# Patient Record
Sex: Female | Born: 1935 | Race: White | Hispanic: No | Marital: Married | Smoking: Never smoker
Health system: Southern US, Community
[De-identification: ages and names within clinical notes are randomized; demographics above are authoritative.]

## PROBLEM LIST (undated history)

## (undated) DIAGNOSIS — K8020 Calculus of gallbladder without cholecystitis without obstruction: Secondary | ICD-10-CM

## (undated) DIAGNOSIS — R011 Cardiac murmur, unspecified: Secondary | ICD-10-CM

## (undated) DIAGNOSIS — H8390 Unspecified disease of inner ear, unspecified ear: Secondary | ICD-10-CM

## (undated) DIAGNOSIS — I10 Essential (primary) hypertension: Secondary | ICD-10-CM

## (undated) DIAGNOSIS — Z8719 Personal history of other diseases of the digestive system: Secondary | ICD-10-CM

## (undated) DIAGNOSIS — D649 Anemia, unspecified: Secondary | ICD-10-CM

## (undated) DIAGNOSIS — E785 Hyperlipidemia, unspecified: Secondary | ICD-10-CM

## (undated) DIAGNOSIS — K219 Gastro-esophageal reflux disease without esophagitis: Secondary | ICD-10-CM

## (undated) HISTORY — PX: OTHER SURGICAL HISTORY: SHX169

## (undated) HISTORY — PX: TONSILECTOMY, ADENOIDECTOMY, BILATERAL MYRINGOTOMY AND TUBES: SHX2538

---

## 1998-10-05 ENCOUNTER — Encounter (HOSPITAL_COMMUNITY): Admission: RE | Admit: 1998-10-05 | Discharge: 1998-11-29 | Payer: Self-pay | Admitting: Family Medicine

## 1998-11-30 ENCOUNTER — Encounter (HOSPITAL_COMMUNITY): Admission: RE | Admit: 1998-11-30 | Discharge: 1999-02-28 | Payer: Self-pay | Admitting: Family Medicine

## 1999-02-28 ENCOUNTER — Encounter: Admission: RE | Admit: 1999-02-28 | Discharge: 1999-04-15 | Payer: Self-pay | Admitting: Family Medicine

## 2001-12-16 ENCOUNTER — Encounter: Payer: Self-pay | Admitting: Family Medicine

## 2001-12-16 ENCOUNTER — Encounter: Admission: RE | Admit: 2001-12-16 | Discharge: 2001-12-16 | Payer: Self-pay | Admitting: Family Medicine

## 2003-04-02 ENCOUNTER — Emergency Department (HOSPITAL_COMMUNITY): Admission: EM | Admit: 2003-04-02 | Discharge: 2003-04-02 | Payer: Self-pay | Admitting: Emergency Medicine

## 2013-08-04 ENCOUNTER — Encounter (HOSPITAL_BASED_OUTPATIENT_CLINIC_OR_DEPARTMENT_OTHER): Payer: Self-pay | Attending: Plastic Surgery

## 2015-01-09 ENCOUNTER — Encounter (HOSPITAL_COMMUNITY): Payer: Self-pay | Admitting: Emergency Medicine

## 2015-01-09 ENCOUNTER — Emergency Department (HOSPITAL_COMMUNITY): Payer: Medicare Other

## 2015-01-09 ENCOUNTER — Inpatient Hospital Stay (HOSPITAL_COMMUNITY)
Admission: EM | Admit: 2015-01-09 | Discharge: 2015-01-11 | DRG: 291 | Disposition: A | Discharge status: Home / Self Care | Payer: Medicare Other | Attending: Internal Medicine | Admitting: Internal Medicine

## 2015-01-09 DIAGNOSIS — R8290 Unspecified abnormal findings in urine: Secondary | ICD-10-CM | POA: Diagnosis present

## 2015-01-09 DIAGNOSIS — K219 Gastro-esophageal reflux disease without esophagitis: Secondary | ICD-10-CM | POA: Diagnosis present

## 2015-01-09 DIAGNOSIS — Z8719 Personal history of other diseases of the digestive system: Secondary | ICD-10-CM

## 2015-01-09 DIAGNOSIS — D649 Anemia, unspecified: Secondary | ICD-10-CM

## 2015-01-09 DIAGNOSIS — I10 Essential (primary) hypertension: Secondary | ICD-10-CM | POA: Diagnosis present

## 2015-01-09 DIAGNOSIS — I509 Heart failure, unspecified: Secondary | ICD-10-CM | POA: Diagnosis not present

## 2015-01-09 DIAGNOSIS — J9621 Acute and chronic respiratory failure with hypoxia: Secondary | ICD-10-CM | POA: Diagnosis present

## 2015-01-09 DIAGNOSIS — I5033 Acute on chronic diastolic (congestive) heart failure: Secondary | ICD-10-CM | POA: Diagnosis present

## 2015-01-09 DIAGNOSIS — R0602 Shortness of breath: Secondary | ICD-10-CM | POA: Diagnosis present

## 2015-01-09 DIAGNOSIS — R011 Cardiac murmur, unspecified: Secondary | ICD-10-CM | POA: Diagnosis not present

## 2015-01-09 DIAGNOSIS — I248 Other forms of acute ischemic heart disease: Secondary | ICD-10-CM | POA: Diagnosis present

## 2015-01-09 DIAGNOSIS — E785 Hyperlipidemia, unspecified: Secondary | ICD-10-CM | POA: Diagnosis present

## 2015-01-09 HISTORY — DX: Gastro-esophageal reflux disease without esophagitis: K21.9

## 2015-01-09 HISTORY — DX: Anemia, unspecified: D64.9

## 2015-01-09 HISTORY — DX: Essential (primary) hypertension: I10

## 2015-01-09 HISTORY — DX: Personal history of other diseases of the digestive system: Z87.19

## 2015-01-09 HISTORY — DX: Calculus of gallbladder without cholecystitis without obstruction: K80.20

## 2015-01-09 HISTORY — DX: Unspecified disease of inner ear, unspecified ear: H83.90

## 2015-01-09 HISTORY — DX: Hyperlipidemia, unspecified: E78.5

## 2015-01-09 HISTORY — DX: Cardiac murmur, unspecified: R01.1

## 2015-01-09 LAB — RETICULOCYTES
RBC.: 3.69 MIL/uL — ABNORMAL LOW (ref 3.87–5.11)
RETIC COUNT ABSOLUTE: 40.6 10*3/uL (ref 19.0–186.0)
RETIC CT PCT: 1.1 % (ref 0.4–3.1)

## 2015-01-09 LAB — URINALYSIS, ROUTINE W REFLEX MICROSCOPIC
Bilirubin Urine: NEGATIVE
Glucose, UA: NEGATIVE mg/dL
Ketones, ur: NEGATIVE mg/dL
Nitrite: POSITIVE — AB
Protein, ur: NEGATIVE mg/dL
Specific Gravity, Urine: 1.011 (ref 1.005–1.030)
Urobilinogen, UA: 0.2 mg/dL (ref 0.0–1.0)
pH: 5 (ref 5.0–8.0)

## 2015-01-09 LAB — IRON AND TIBC
Iron: 41 ug/dL (ref 28–170)
Saturation Ratios: 14 % (ref 10.4–31.8)
TIBC: 300 ug/dL (ref 250–450)
UIBC: 259 ug/dL

## 2015-01-09 LAB — MAGNESIUM: MAGNESIUM: 2.1 mg/dL (ref 1.7–2.4)

## 2015-01-09 LAB — COMPREHENSIVE METABOLIC PANEL
ALT: 16 U/L (ref 14–54)
AST: 21 U/L (ref 15–41)
Albumin: 3.8 g/dL (ref 3.5–5.0)
Alkaline Phosphatase: 66 U/L (ref 38–126)
Anion gap: 6 (ref 5–15)
BUN: 21 mg/dL — ABNORMAL HIGH (ref 6–20)
CO2: 25 mmol/L (ref 22–32)
Calcium: 9 mg/dL (ref 8.9–10.3)
Chloride: 107 mmol/L (ref 101–111)
Creatinine, Ser: 1.09 mg/dL — ABNORMAL HIGH (ref 0.44–1.00)
GFR calc Af Amer: 50 mL/min — ABNORMAL LOW (ref >60)
GFR calc non Af Amer: 43 mL/min — ABNORMAL LOW (ref >60)
Glucose, Bld: 105 mg/dL — ABNORMAL HIGH (ref 65–99)
Potassium: 4.6 mmol/L (ref 3.5–5.1)
Sodium: 138 mmol/L (ref 135–145)
Total Bilirubin: 0.8 mg/dL (ref 0.3–1.2)
Total Protein: 6.9 g/dL (ref 6.5–8.1)

## 2015-01-09 LAB — URINE MICROSCOPIC-ADD ON

## 2015-01-09 LAB — CBC WITH DIFFERENTIAL/PLATELET
Basophils Absolute: 0 10*3/uL (ref 0.0–0.1)
Basophils Relative: 0 % (ref 0–1)
Eosinophils Absolute: 0 10*3/uL (ref 0.0–0.7)
Eosinophils Relative: 1 % (ref 0–5)
HCT: 34.7 % — ABNORMAL LOW (ref 36.0–46.0)
Hemoglobin: 10.7 g/dL — ABNORMAL LOW (ref 12.0–15.0)
Lymphocytes Relative: 49 % — ABNORMAL HIGH (ref 12–46)
Lymphs Abs: 4.1 10*3/uL — ABNORMAL HIGH (ref 0.7–4.0)
MCH: 28.7 pg (ref 26.0–34.0)
MCHC: 30.8 g/dL (ref 30.0–36.0)
MCV: 93 fL (ref 78.0–100.0)
Monocytes Absolute: 0.4 10*3/uL (ref 0.1–1.0)
Monocytes Relative: 5 % (ref 3–12)
Neutro Abs: 3.7 10*3/uL (ref 1.7–7.7)
Neutrophils Relative %: 45 % (ref 43–77)
Platelets: 147 10*3/uL — ABNORMAL LOW (ref 150–400)
RBC: 3.73 MIL/uL — ABNORMAL LOW (ref 3.87–5.11)
RDW: 16.5 % — ABNORMAL HIGH (ref 11.5–15.5)
WBC: 8.3 10*3/uL (ref 4.0–10.5)

## 2015-01-09 LAB — FOLATE: Folate: 10.4 ng/mL (ref >5.9)

## 2015-01-09 LAB — BRAIN NATRIURETIC PEPTIDE: B Natriuretic Peptide: 1937.2 pg/mL — ABNORMAL HIGH (ref 0.0–100.0)

## 2015-01-09 LAB — VITAMIN B12: Vitamin B-12: 224 pg/mL (ref 180–914)

## 2015-01-09 LAB — FERRITIN: FERRITIN: 30 ng/mL (ref 11–307)

## 2015-01-09 LAB — TSH: TSH: 3.757 u[IU]/mL (ref 0.350–4.500)

## 2015-01-09 LAB — TROPONIN I: Troponin I: 0.1 ng/mL — ABNORMAL HIGH (ref <0.031)

## 2015-01-09 MED ORDER — SODIUM CHLORIDE 0.9 % IJ SOLN
3.0000 mL | Freq: Two times a day (BID) | INTRAMUSCULAR | Status: DC
  Administered 2015-01-09 – 2015-01-11 (×4): 3 mL via INTRAVENOUS

## 2015-01-09 MED ORDER — SODIUM CHLORIDE 0.9 % IV SOLN: 250.0000 mL | INTRAVENOUS | Status: DC | PRN

## 2015-01-09 MED ORDER — NITROGLYCERIN 2 % TD OINT
1.0000 [in_us] | TOPICAL_OINTMENT | Freq: Once | TRANSDERMAL | Status: AC
  Administered 2015-01-09: 1 [in_us] via TOPICAL
  Filled 2015-01-09: qty 1

## 2015-01-09 MED ORDER — FUROSEMIDE 10 MG/ML IJ SOLN
20.0000 mg | Freq: Once | INTRAMUSCULAR | Status: AC
  Administered 2015-01-09: 20 mg via INTRAVENOUS
  Filled 2015-01-09: qty 2

## 2015-01-09 MED ORDER — CARVEDILOL 3.125 MG PO TABS
3.1250 mg | ORAL_TABLET | Freq: Two times a day (BID) | ORAL | Status: DC
Start: 2015-01-09 — End: 2015-01-11
  Administered 2015-01-09 – 2015-01-11 (×4): 3.125 mg via ORAL
  Filled 2015-01-09 (×4): qty 1

## 2015-01-09 MED ORDER — ACETAMINOPHEN 325 MG PO TABS
650.0000 mg | ORAL_TABLET | ORAL | Status: DC | PRN
  Administered 2015-01-09 – 2015-01-10 (×3): 650 mg via ORAL
  Filled 2015-01-09 (×3): qty 2

## 2015-01-09 MED ORDER — ENOXAPARIN SODIUM 40 MG/0.4ML ~~LOC~~ SOLN
40.0000 mg | SUBCUTANEOUS | Status: DC
  Administered 2015-01-09 – 2015-01-10 (×2): 40 mg via SUBCUTANEOUS
  Filled 2015-01-09 (×2): qty 0.4

## 2015-01-09 MED ORDER — ASPIRIN EC 81 MG PO TBEC
81.0000 mg | DELAYED_RELEASE_TABLET | Freq: Every day | ORAL | Status: DC
  Administered 2015-01-09 – 2015-01-11 (×3): 81 mg via ORAL
  Filled 2015-01-09 (×3): qty 1

## 2015-01-09 MED ORDER — SIMETHICONE 80 MG PO CHEW: 80.0000 mg | CHEWABLE_TABLET | Freq: Four times a day (QID) | ORAL | Status: DC | PRN

## 2015-01-09 MED ORDER — GI COCKTAIL ~~LOC~~
30.0000 mL | Freq: Three times a day (TID) | ORAL | Status: DC | PRN
  Administered 2015-01-10 (×2): 30 mL via ORAL
  Filled 2015-01-09 (×3): qty 30

## 2015-01-09 MED ORDER — PANTOPRAZOLE SODIUM 40 MG PO TBEC
40.0000 mg | DELAYED_RELEASE_TABLET | Freq: Every day | ORAL | Status: DC
  Administered 2015-01-10 – 2015-01-11 (×2): 40 mg via ORAL
  Filled 2015-01-09 (×2): qty 1

## 2015-01-09 MED ORDER — SODIUM CHLORIDE 0.9 % IJ SOLN: 3.0000 mL | INTRAMUSCULAR | Status: DC | PRN

## 2015-01-09 MED ORDER — DIMENHYDRINATE 50 MG PO TABS
50.0000 mg | ORAL_TABLET | Freq: Three times a day (TID) | ORAL | Status: DC | PRN
  Filled 2015-01-09: qty 1

## 2015-01-09 MED ORDER — SIMVASTATIN 20 MG PO TABS
20.0000 mg | ORAL_TABLET | Freq: Every day | ORAL | Status: DC
  Administered 2015-01-09 – 2015-01-10 (×2): 20 mg via ORAL
  Filled 2015-01-09 (×2): qty 1

## 2015-01-09 MED ORDER — ONDANSETRON HCL 4 MG/2ML IJ SOLN
4.0000 mg | Freq: Four times a day (QID) | INTRAMUSCULAR | Status: DC | PRN
  Administered 2015-01-11 (×2): 4 mg via INTRAVENOUS
  Filled 2015-01-09 (×2): qty 2

## 2015-01-09 MED ORDER — FUROSEMIDE 10 MG/ML IJ SOLN
20.0000 mg | Freq: Two times a day (BID) | INTRAMUSCULAR | Status: DC
  Administered 2015-01-09 – 2015-01-10 (×2): 20 mg via INTRAVENOUS
  Filled 2015-01-09 (×2): qty 2

## 2015-01-09 NOTE — ED Notes (Signed)
Patient transported to X-ray 

## 2015-01-09 NOTE — ED Notes (Signed)
Son stated, shes been feeling sick for about 2 weeks with gallstones which they can't do surgery cause of her heart.  Shes been SOB and having wheezing spells.

## 2015-01-09 NOTE — H&P (Signed)
[facility] History and Physical  Rebecca Woodard:[id number] DOB: 1922-11-11 DOA: 01/09/2015  Referring physician: Dr. Rennis Chris PCP: Lupita Raider, MD   Chief Complaint: Shortness of breath  HPI: Rebecca Woodard is a 81 y.o. female  With history of hypertension, hyperlipidemia, heart murmur, gallstones, in the ear disease presented to the ED with a 2-3 week history of worsening shortness of breath on exertion occurring especially in the mornings when she wakes up in the mornings per her family. Family also reports physicians had some paroxysmal nocturnal dyspnea and questionable occasional orthopnea. Patient and family deny any chest pain. No fever, no chills. Family does endorse that patient gets nauseous and sometimes with emesis when she eats and they feel by be secondary to a gallstones. Patient denies any constipation, no dysuria, no cough, no melanotic, no hematemesis, no hematochezia. Patient does states may have some occasional diarrhea however none at this time. Patient does endorse some generalized weakness. Patient was seen in the ED compressive metabolic profile.had a BUN of 21 creatinine of 1.09 glucose of 105 otherwise was within normal limits. BNP was 1937.2. Chest x-ray was consistent with CHF/volume overload. CBC had a hemoglobin of 10.7 platelet count of 147 otherwise was within normal limits. Urinalysis had trace leukocytes positive nitrite 3-6 WBCs. EKG had normal sinus rhythm with poor R-wave progression, prolonged QTC, prolonged PR interval. No ischemic changes noted.   Review of Systems:  As per history of present illness otherwise negative. Constitutional:  No weight loss, night sweats, Fevers, chills, fatigue.  HEENT:  No headaches, Difficulty swallowing,Tooth/dental problems,Sore throat,  No sneezing, itching, ear ache, nasal congestion, post nasal drip,  Cardio-vascular:  No chest pain, Orthopnea, PND, swelling in lower extremities, anasarca, dizziness,  palpitations  GI:  No heartburn, indigestion, abdominal pain, nausea, vomiting, diarrhea, change in bowel habits, loss of appetite  Resp:  No shortness of breath with exertion or at rest. No excess mucus, no productive cough, No non-productive cough, No coughing up of blood.No change in color of mucus.No wheezing.No chest Ghazarian deformity  Skin:  no rash or lesions.  GU:  no dysuria, change in color of urine, no urgency or frequency. No flank pain.  Musculoskeletal:  No joint pain or swelling. No decreased range of motion. No back pain.  Psych:  No change in mood or affect. No depression or anxiety. No memory loss.   Past Medical History  Diagnosis Date  . Acid reflux   . Gallstones   . Heart murmur   . Inner ear disease   . HTN (hypertension) 01/09/2015  . GERD (gastroesophageal reflux disease) 01/09/2015  . Hyperlipidemia 01/09/2015  . History of gallstones 01/09/2015  . Anemia 01/09/2015   History reviewed. No pertinent past surgical history. Social History:  reports that she has never smoked. She does not have any smokeless tobacco history on file. She reports that she does not drink alcohol or use illicit drugs.  Allergies  Allergen Reactions  . Salicylates     All nsaids    History reviewed. No pertinent family history.  Mother deceased in her 49s  from CHF. Father deceased cause unknown.   Prior to Admission medications   Medication Sig Start Date End Date Taking? Authorizing Provider  dimenhyDRINATE (DRAMAMINE) 50 MG tablet Take 50 mg by mouth every 8 (eight) hours as needed.   Yes Historical Provider, MD  hydrochlorothiazide (HYDRODIURIL) 25 MG tablet Take 12.5 mg by mouth daily.   Yes Historical Provider, MD  omeprazole (PRILOSEC) 20 MG  capsule Take 20 mg by mouth 2 (two) times daily before a meal.   Yes Historical Provider, MD  simethicone (MYLICON) 80 MG chewable tablet Chew 80 mg by mouth every 6 (six) hours as needed for flatulence.   Yes Historical Provider, MD    simvastatin (ZOCOR) 20 MG tablet Take 20 mg by mouth daily at 6 PM.   Yes Historical Provider, MD   Physical Exam: Filed Vitals:   01/09/15 1100 01/09/15 1130 01/09/15 1200 01/09/15 1230  BP: 142/71 139/60 142/61 135/63  Pulse: 93 83 84 79  Temp:      Resp: 24 17 15    Height:      Weight:      SpO2: 93% 94% 93% 94%    Wt Readings from Last 3 Encounters:  01/09/15 74.844 kg (165 lb)    General:  Elderly female well-developed well-nourished in no acute cardiopulmonary distress. Speaking in full sentences Eyes: PERRLA, EOMI, normal lids, irises & conjunctiva ENT: grossly normal hearing, lips & tongue Neck: no LAD, masses or thyromegaly. Positive JVD Cardiovascular: RRR with 3/6 systolic ejection murmur. Trace to 1+ bilateral lower extremity edema. Positive JVD Telemetry: SR, no arrhythmias  Respiratory: Decreased breath sounds in the bases. Bibasilar crackles. No wheezing. No rhonchi. Abdomen: soft, ntnd, positive bowel sounds, no rebound, no guarding Skin: no rash or induration seen on limited exam Musculoskeletal: grossly normal tone BUE/BLE Psychiatric: grossly normal mood and affect, speech fluent and appropriate Neurologic: Alert and oriented 3. Cranial nerves II through XII are grossly intact. No focal deficits.           Labs on Admission:  Basic Metabolic Panel:  Recent Labs Lab 01/09/15 1110  NA 138  K 4.6  CL 107  CO2 25  GLUCOSE 105*  BUN 21*  CREATININE 1.09*  CALCIUM 9.0   Liver Function Tests:  Recent Labs Lab 01/09/15 1110  AST 21  ALT 16  ALKPHOS 66  BILITOT 0.8  PROT 6.9  ALBUMIN 3.8   No results for input(s): LIPASE, AMYLASE in the last 168 hours. No results for input(s): AMMONIA in the last 168 hours. CBC:  Recent Labs Lab 01/09/15 1110  WBC 8.3  NEUTROABS 3.7  HGB 10.7*  HCT 34.7*  MCV 93.0  PLT 147*   Cardiac Enzymes: No results for input(s): CKTOTAL, CKMB, CKMBINDEX, TROPONINI in the last 168 hours.  BNP (last 3  results)  Recent Labs  01/09/15 1110  BNP 1937.2*    ProBNP (last 3 results) No results for input(s): PROBNP in the last 8760 hours.  CBG: No results for input(s): GLUCAP in the last 168 hours.  Radiological Exams on Admission: Dg Chest 2 View  01/09/2015   CLINICAL DATA:  Shortness of breath for the last few weeks, worsening  EXAM: CHEST  2 VIEW  COMPARISON:  None.  FINDINGS: CHF pattern with diffuse interstitial opacity and small bilateral pleural effusions. Atypical infection could have this appearance but there is no indication of infectious symptoms per report and the heart and vascular pedicle are enlarged.  IMPRESSION: CHF pattern.   Electronically Signed   By: Marnee Spring M.D.   On: 01/09/2015 10:44    EKG: Independently reviewed. Normal sinus rhythm. Prolonged PR interval. Prolonged QTC. Poor R-wave progression.  Assessment/Plan Principal Problem:   Acute exacerbation of CHF (congestive heart failure) Active Problems:   HTN (hypertension)   GERD (gastroesophageal reflux disease)   Heart murmur   Hyperlipidemia   History of gallstones   Anemia  Abnormal urinalysis  #1 acute exacerbation of CHF Questionable etiology. Patient denies any chest pain. Patient however has been having some nausea and emesis. Patient with history of hypertension and hyperlipidemia. Patient noted to have a murmur on examination wishes states is old. Patient noted to be volume overloaded. Chest x-ray with an elevated BNP. Admit patient to telemetry. Cycle cardiac enzymes every 6 hours 3. Check a TSH. Check a 2-D echo. Check a fasting lipid panel. We'll place on Lasix 20 mg IV every 12 hours. Continue home dose statin. Will place on low-dose Coreg 3.125 mg twice a day. Strict I's and O's. Daily weights. If 2-D echo is abnormal or cardiac enzymes are elevated will need to consult with cardiology. Follow for now.  #2 hypertension Patient on IV diuretics. Patient be started on low-dose  Coreg.  #3 gastroesophageal reflux disease PPI.  #4 heart murmur Check a 2-D echo.  #5 hyperlipidemia Check a fasting lipid panel. Continue home dose statin.  #6 abnormal urinalysis Will check urine cultures. Urine cultures are positive we'll then subsequently placed on an antibiotic.  #7 history of gallstones Patient currently asymptomatic on examination. LFTs within normal limits. Outpatient follow-up.  #8 anemia Patient with no overt bleeding. Check an anemia panel. Follow H&H.  #9 prophylaxis PPI for GI prophylaxis. Lovenox for DVT prophylaxis.   Code Status: Full DVT Prophylaxis: Lovenox Family Communication updated patient, daughters and son and grandson at bedside. Disposition Plan: Admit to telemetry.  Time spent: 70 minutes  [facility] Pager [phone]

## 2015-01-09 NOTE — ED Provider Notes (Addendum)
CSN: [id number]     Arrival date & time 01/09/15  0831 History   First MD Initiated Contact with Patient 01/09/15 [phone]     Chief Complaint  Patient presents with  . Abdominal Pain  . Nausea  . Shortness of Breath     (Consider location/radiation/quality/duration/timing/severity/associated sxs/prior Treatment) HPI Patient has been intermittently short of breath for the past 3 weeks. She reports that she was wheezing this morning. She also complains of abdominal pain for the past 2-3 years due to "gallstones" no pain at present. Pain is worse with eating. She treated herself with Gas-X and with omeprazole this morning with relief. No nausea or vomiting She is probably asymptomatic no fever no chest pain no vomiting last bowel movement this morning normal. No urinary symptoms no other associated symptoms. Shortness of breath is worse with walking improved with rest Past Medical History  Diagnosis Date  . Acid reflux   . Gallstones   . Heart murmur   . Inner ear disease    History reviewed. No pertinent past surgical history. No family history on file. Social History  Substance Use Topics  . Smoking status: Never Smoker   . Smokeless tobacco: None  . Alcohol Use: No   OB History    No data available     Review of Systems  Respiratory: Positive for shortness of breath and wheezing.   Gastrointestinal: Positive for abdominal pain.  Musculoskeletal: Positive for gait problem.       Walks with walker  All other systems reviewed and are negative.     Allergies  Review of patient's allergies indicates no known allergies.  Home Medications   Prior to Admission medications   Not on File   BP 155/70 mmHg  Pulse 87  Temp(Src) 98 F (36.7 C)  Resp 17  Ht 5' 2.5" (1.588 m)  Wt 165 lb (74.844 kg)  BMI 29.68 kg/m2  SpO2 97% Physical Exam  Constitutional: She appears well-developed and well-nourished. No distress.  HENT:  Head: Normocephalic and atraumatic.  Eyes:  Conjunctivae are normal. Pupils are equal, round, and reactive to light.  Neck: Neck supple. No tracheal deviation present. No thyromegaly present.  Cardiovascular: Normal rate and regular rhythm.   No murmur heard. Pulmonary/Chest: Effort normal.  Diffuse scant rales  Abdominal: Soft. Bowel sounds are normal. She exhibits no distension. There is no tenderness.  Musculoskeletal: Normal range of motion. She exhibits no edema or tenderness.  Neurological: She is alert. Coordination normal.  Skin: Skin is warm and dry. No rash noted.  Psychiatric: She has a normal mood and affect.  Nursing note and vitals reviewed.   ED Course  Procedures (including critical care time) Labs Review Labs Reviewed - No data to display  Imaging Review No results found. I have personally reviewed and evaluated these images and lab results as part of my medical decision-making.   EKG Interpretation   Date/Time:  Saturday January 09 2015 08:47:30 EDT Ventricular Rate:  88 PR Interval:  244 QRS Duration: 102 QT Interval:  416 QTC Calculation: 503 R Axis:   -3 Text Interpretation:  Sinus rhythm Prolonged PR interval Abnormal R-wave  progression, early transition Prolonged QT interval No old tracing to  compare Confirmed by Neeraj Housand  MD, Zaylen Susman [phone]) on 01/09/2015 10:19:48 AM     Chest x-ray reviewed by me Results for orders placed or performed during the hospital encounter of 01/09/15  Comprehensive metabolic panel  Result Value Ref Range   Sodium 138 135 -  145 mmol/L   Potassium 4.6 3.5 - 5.1 mmol/L   Chloride 107 101 - 111 mmol/L   CO2 25 22 - 32 mmol/L   Glucose, Bld 105 (H) 65 - 99 mg/dL   BUN 21 (H) 6 - 20 mg/dL   Creatinine, Ser 1.61 (H) 0.44 - 1.00 mg/dL   Calcium 9.0 8.9 - 09.6 mg/dL   Total Protein 6.9 6.5 - 8.1 g/dL   Albumin 3.8 3.5 - 5.0 g/dL   AST 21 15 - 41 U/L   ALT 16 14 - 54 U/L   Alkaline Phosphatase 66 38 - 126 U/L   Total Bilirubin 0.8 0.3 - 1.2 mg/dL   GFR calc non  Af Amer 43 (L) >60 mL/min   GFR calc Af Amer 50 (L) >60 mL/min   Anion gap 6 5 - 15  CBC with Differential/Platelet  Result Value Ref Range   WBC 8.3 4.0 - 10.5 K/uL   RBC 3.73 (L) 3.87 - 5.11 MIL/uL   Hemoglobin 10.7 (L) 12.0 - 15.0 g/dL   HCT 04.5 (L) 40.9 - 81.1 %   MCV 93.0 78.0 - 100.0 fL   MCH 28.7 26.0 - 34.0 pg   MCHC 30.8 30.0 - 36.0 g/dL   RDW 91.4 (H) 78.2 - 95.6 %   Platelets 147 (L) 150 - 400 K/uL   Neutrophils Relative % 45 43 - 77 %   Neutro Abs 3.7 1.7 - 7.7 K/uL   Lymphocytes Relative 49 (H) 12 - 46 %   Lymphs Abs 4.1 (H) 0.7 - 4.0 K/uL   Monocytes Relative 5 3 - 12 %   Monocytes Absolute 0.4 0.1 - 1.0 K/uL   Eosinophils Relative 1 0 - 5 %   Eosinophils Absolute 0.0 0.0 - 0.7 K/uL   Basophils Relative 0 0 - 1 %   Basophils Absolute 0.0 0.0 - 0.1 K/uL  Urinalysis, Routine w reflex microscopic (not at [facility])  Result Value Ref Range   Color, Urine YELLOW YELLOW   APPearance CLEAR CLEAR   Specific Gravity, Urine 1.011 1.005 - 1.030   pH 5.0 5.0 - 8.0   Glucose, UA NEGATIVE NEGATIVE mg/dL   Hgb urine dipstick LARGE (A) NEGATIVE   Bilirubin Urine NEGATIVE NEGATIVE   Ketones, ur NEGATIVE NEGATIVE mg/dL   Protein, ur NEGATIVE NEGATIVE mg/dL   Urobilinogen, UA 0.2 0.0 - 1.0 mg/dL   Nitrite POSITIVE (A) NEGATIVE   Leukocytes, UA TRACE (A) NEGATIVE  Brain natriuretic peptide  Result Value Ref Range   B Natriuretic Peptide 1937.2 (H) 0.0 - 100.0 pg/mL  Urine microscopic-add on  Result Value Ref Range   Squamous Epithelial / LPF FEW (A) RARE   WBC, UA 3-6 <3 WBC/hpf   RBC / HPF 11-20 <3 RBC/hpf   Bacteria, UA MANY (A) RARE   Dg Chest 2 View  01/09/2015   CLINICAL DATA:  Shortness of breath for the last few weeks, worsening  EXAM: CHEST  2 VIEW  COMPARISON:  None.  FINDINGS: CHF pattern with diffuse interstitial opacity and small bilateral pleural effusions. Atypical infection could have this appearance but there is no indication of infectious symptoms per report  and the heart and vascular pedicle are enlarged.  IMPRESSION: CHF pattern.   Electronically Signed   By: Marnee Spring M.D.   On: 01/09/2015 10:44    MDM  Spoke with Dr. Janee Morn plan IV diuresis, nitroglycerin paste. Admit to telemetry. Urine sent for culture, will not treat with antibiotics as no urinary  symptoms Diagnoses #1 congestive heart failure #2chronic abdominal pain #3anemia Final diagnoses:  None        Doug Sou, MD 01/09/15 1318  Doug Sou, MD 01/09/15 1655

## 2015-01-09 NOTE — Progress Notes (Signed)
Arrived in the unit at 3:45 pm via stretcher from ED with EMT personnel and family members. Denies any pain. No distress noted. Oriented to the room.  Patient verbalized understanding. Call bell within reach.

## 2015-01-09 NOTE — ED Notes (Signed)
Attempted report 

## 2015-01-09 NOTE — Care Management Note (Signed)
Case Management Note  Patient Details  Name: Rebecca Woodard MRN: [id number] Date of Birth: [birth year]  Subjective/Objective:   81 y.o. F admitted to Telemetry with Acute Exacerbation of CHF/Volume Overload. Treated with IV diuretics and close monitoring.                  Action/Plan:CM will continue to follow as condition improves to assess for final disposition and home needs. UR Completed.    Expected Discharge Date:                  Expected Discharge Plan:     In-House Referral:     Discharge planning Services     Post Acute Care Choice:    Choice offered to:     DME Arranged:    DME Agency:     HH Arranged:    HH Agency:     Status of Service:     Medicare Important Message Given:    Date Medicare IM Given:    Medicare IM give by:    Date Additional Medicare IM Given:    Additional Medicare Important Message give by:     If discussed at Long Length of Stay Meetings, dates discussed:    Additional Comments:  Yvone Neu, RN 01/09/2015, 2:50 PM

## 2015-01-09 NOTE — ED Notes (Signed)
Admitting phy at bedside  

## 2015-01-10 DIAGNOSIS — I509 Heart failure, unspecified: Secondary | ICD-10-CM

## 2015-01-10 LAB — BASIC METABOLIC PANEL
ANION GAP: 10 (ref 5–15)
BUN: 23 mg/dL — ABNORMAL HIGH (ref 6–20)
CALCIUM: 8.9 mg/dL (ref 8.9–10.3)
CO2: 28 mmol/L (ref 22–32)
CREATININE: 1.12 mg/dL — AB (ref 0.44–1.00)
Chloride: 102 mmol/L (ref 101–111)
GFR calc non Af Amer: 42 mL/min — ABNORMAL LOW (ref >60)
GFR, EST AFRICAN AMERICAN: 49 mL/min — AB (ref >60)
Glucose, Bld: 106 mg/dL — ABNORMAL HIGH (ref 65–99)
Potassium: 4.1 mmol/L (ref 3.5–5.1)
SODIUM: 140 mmol/L (ref 135–145)

## 2015-01-10 LAB — CBC
HCT: 34.7 % — ABNORMAL LOW (ref 36.0–46.0)
Hemoglobin: 10.9 g/dL — ABNORMAL LOW (ref 12.0–15.0)
MCH: 28.9 pg (ref 26.0–34.0)
MCHC: 31.4 g/dL (ref 30.0–36.0)
MCV: 92 fL (ref 78.0–100.0)
PLATELETS: 121 10*3/uL — AB (ref 150–400)
RBC: 3.77 MIL/uL — AB (ref 3.87–5.11)
RDW: 16.4 % — ABNORMAL HIGH (ref 11.5–15.5)
WBC: 8.1 10*3/uL (ref 4.0–10.5)

## 2015-01-10 LAB — LIPID PANEL
CHOLESTEROL: 214 mg/dL — AB (ref 0–200)
HDL: 49 mg/dL (ref >40)
LDL Cholesterol: 144 mg/dL — ABNORMAL HIGH (ref 0–99)
TRIGLYCERIDES: 104 mg/dL (ref <150)
Total CHOL/HDL Ratio: 4.4 RATIO
VLDL: 21 mg/dL (ref 0–40)

## 2015-01-10 LAB — TROPONIN I
TROPONIN I: 0.09 ng/mL — AB (ref <0.031)
Troponin I: 0.1 ng/mL — ABNORMAL HIGH (ref <0.031)

## 2015-01-10 MED ORDER — FUROSEMIDE 40 MG PO TABS
40.0000 mg | ORAL_TABLET | Freq: Every day | ORAL | Status: DC
  Administered 2015-01-11: 40 mg via ORAL
  Filled 2015-01-10: qty 1

## 2015-01-10 MED ORDER — ISOSORBIDE MONONITRATE ER 30 MG PO TB24
30.0000 mg | ORAL_TABLET | Freq: Every day | ORAL | Status: DC
  Administered 2015-01-10 – 2015-01-11 (×2): 30 mg via ORAL
  Filled 2015-01-10 (×2): qty 1

## 2015-01-10 NOTE — Progress Notes (Signed)
Patient Demographics:    Rebecca Woodard, is a 81 y.o. female, DOB - January 09, 1923, ZOX:[id number]  Admit date - 01/09/2015   Admitting Physician Rodolph Bong, MD  Outpatient Primary MD for the patient is Lupita Raider, MD  LOS - 1   Chief Complaint  Patient presents with  . Abdominal Pain  . Nausea  . Shortness of Breath        Subjective:    Rebecca Woodard today has, No headache, No chest pain, No abdominal pain - No Nausea, No new weakness tingling or numbness, No Cough - much improved shortness of breath.   Assessment  & Plan :     1. Acute on chronic hypoxic respiratory failure secondary to acute on chronic CHF with likely AS. Echo pending. She is much improved after gentle diuresis, symptoms have almost completely resolved, off oxygen. Continue Coreg, low-dose Lasix, Imdur added, continue aspirin. Echo pending. Likely discharge in the morning. Family does not wish PT evaluation or any heroin measures which I think is appropriate considering her age.  2. Mild troponin rise. From demand ischemia due to #1 above. Chest pain-free, EKG nonacute. Troponin trend is not ACS. Evaluate echo for prognostic purposes.  3. Loud aortic systolic murmur. Check echo. Medical management and outpatient cardiology follow-up.  4. Dyslipidemia. Continue home dose statin.  5. Borderline UA. No dysuria currently monitor. Follow urine cultures.  6. GERD. On PPI continue.  7. Essential hypertension. Stable on Coreg and Imdur.  8. Hx of gallstones. No acute issue.  9. Chronic anemia. Follow with PCP.    Code Status : Full  Family Communication  : Family bedside  Disposition Plan  : Home tomorrow  Consults  :  None  Procedures  : Echogram  DVT Prophylaxis  :  Lovenox    Lab Results  Component Value  Date   PLT 121* 01/10/2015    Inpatient Medications  Scheduled Meds: . aspirin EC  81 mg Oral Daily  . carvedilol  3.125 mg Oral BID WC  . enoxaparin (LOVENOX) injection  40 mg Subcutaneous Q24H  . [START ON 01/11/2015] furosemide  40 mg Oral Daily  . isosorbide mononitrate  30 mg Oral Daily  . pantoprazole  40 mg Oral Q0600  . simvastatin  20 mg Oral q1800  . sodium chloride  3 mL Intravenous Q12H   Continuous Infusions:  PRN Meds:.sodium chloride, acetaminophen, dimenhyDRINATE, gi cocktail, ondansetron (ZOFRAN) IV, simethicone, sodium chloride  Antibiotics  :     Anti-infectives    None        Objective:   Filed Vitals:   01/09/15 1530 01/09/15 1550 01/09/15 2014 01/10/15 0436  BP: 136/58 131/58 115/54 125/57  Pulse: 81 83 73 67  Temp:  98.7 F (37.1 C) 98.7 F (37.1 C) 97.9 F (36.6 C)  TempSrc:  Oral Oral Oral  Resp:  20 18 18   Height:  5\' 5"  (1.651 m)    Weight:  78.5 kg (173 lb 1 oz)  77.2 kg (170 lb 3.1 oz)  SpO2: 95% 94% 98% 98%    Wt Readings from Last 3 Encounters:  01/10/15 77.2 kg (170 lb 3.1 oz)     Intake/Output Summary (Last 24 hours) at 01/10/15 1037 Last data filed at  01/10/15 0902  Gross per 24 hour  Intake    480 ml  Output   4550 ml  Net  -4070 ml     Physical Exam  Awake Alert, Oriented X 3, No new F.N deficits, Normal affect [place].AT,PERRAL Supple Neck,No JVD, No cervical lymphadenopathy appriciated.  Symmetrical Chest Murakami movement, Good air movement bilaterally, CTAB RRR,No Gallops,Rubs , loud aortic systolic Murmur , No Parasternal Heave +ve B.Sounds, Abd Soft, No tenderness, No organomegaly appriciated, No rebound - guarding or rigidity. No Cyanosis, Clubbing or edema, No new Rash or bruise       Data Review:   Micro Results No results found for this or any previous visit (from the past 240 hour(s)).  Radiology Reports Dg Chest 2 View  01/09/2015   CLINICAL DATA:  Shortness of breath for the last few weeks, worsening   EXAM: CHEST  2 VIEW  COMPARISON:  None.  FINDINGS: CHF pattern with diffuse interstitial opacity and small bilateral pleural effusions. Atypical infection could have this appearance but there is no indication of infectious symptoms per report and the heart and vascular pedicle are enlarged.  IMPRESSION: CHF pattern.   Electronically Signed   By: Marnee Spring M.D.   On: 01/09/2015 10:44     CBC  Recent Labs Lab 01/09/15 1110 01/10/15 0647  WBC 8.3 8.1  HGB 10.7* 10.9*  HCT 34.7* 34.7*  PLT 147* 121*  MCV 93.0 92.0  MCH 28.7 28.9  MCHC 30.8 31.4  RDW 16.5* 16.4*  LYMPHSABS 4.1*  --   MONOABS 0.4  --   EOSABS 0.0  --   BASOSABS 0.0  --     Chemistries   Recent Labs Lab 01/09/15 1110 01/09/15 1842 01/10/15 0647  NA 138  --  140  K 4.6  --  4.1  CL 107  --  102  CO2 25  --  28  GLUCOSE 105*  --  106*  BUN 21*  --  23*  CREATININE 1.09*  --  1.12*  CALCIUM 9.0  --  8.9  MG  --  2.1  --   AST 21  --   --   ALT 16  --   --   ALKPHOS 66  --   --   BILITOT 0.8  --   --    ------------------------------------------------------------------------------------------------------------------ estimated creatinine clearance is 34.3 mL/min (by C-G formula based on Cr of 1.12). ------------------------------------------------------------------------------------------------------------------ No results for input(s): HGBA1C in the last 72 hours. ------------------------------------------------------------------------------------------------------------------  Recent Labs  01/10/15 0048  CHOL 214*  HDL 49  LDLCALC 144*  TRIG 104  CHOLHDL 4.4   ------------------------------------------------------------------------------------------------------------------  Recent Labs  01/09/15 1353  TSH 3.757   ------------------------------------------------------------------------------------------------------------------  Recent Labs  01/09/15 1842  VITAMINB12 224  FOLATE 10.4    FERRITIN 30  TIBC 300  IRON 41  RETICCTPCT 1.1    Coagulation profile No results for input(s): INR, PROTIME in the last 168 hours.  No results for input(s): DDIMER in the last 72 hours.  Cardiac Enzymes  Recent Labs Lab 01/09/15 1842 01/10/15 0048 01/10/15 0647  TROPONINI 0.10* 0.10* 0.09*   ------------------------------------------------------------------------------------------------------------------ Invalid input(s): POCBNP   Time Spent in minutes   35   Susa Raring K M.D on 01/10/2015 at 10:37 AM  Between 7am to 7pm - Pager - [phone]  After 7pm go to [email or web address] - password [email or web address]  [facility] -  Office  [phone]

## 2015-01-11 ENCOUNTER — Ambulatory Visit (HOSPITAL_COMMUNITY): Payer: Medicare Other

## 2015-01-11 DIAGNOSIS — I509 Heart failure, unspecified: Secondary | ICD-10-CM

## 2015-01-11 LAB — BASIC METABOLIC PANEL
Anion gap: 7 (ref 5–15)
BUN: 30 mg/dL — AB (ref 6–20)
CO2: 28 mmol/L (ref 22–32)
CREATININE: 1.24 mg/dL — AB (ref 0.44–1.00)
Calcium: 8.5 mg/dL — ABNORMAL LOW (ref 8.9–10.3)
Chloride: 99 mmol/L — ABNORMAL LOW (ref 101–111)
GFR calc Af Amer: 43 mL/min — ABNORMAL LOW (ref >60)
GFR, EST NON AFRICAN AMERICAN: 37 mL/min — AB (ref >60)
GLUCOSE: 110 mg/dL — AB (ref 65–99)
Potassium: 4.1 mmol/L (ref 3.5–5.1)
SODIUM: 134 mmol/L — AB (ref 135–145)

## 2015-01-11 MED ORDER — CARVEDILOL 3.125 MG PO TABS: 3.1250 mg | ORAL_TABLET | Freq: Two times a day (BID) | ORAL | Status: AC

## 2015-01-11 MED ORDER — ONDANSETRON HCL 4 MG PO TABS: 4.0000 mg | ORAL_TABLET | Freq: Three times a day (TID) | ORAL | Status: DC | PRN

## 2015-01-11 MED ORDER — ASPIRIN 81 MG PO TBEC: 81.0000 mg | DELAYED_RELEASE_TABLET | Freq: Every day | ORAL | Status: AC

## 2015-01-11 MED ORDER — FUROSEMIDE 40 MG PO TABS: 20.0000 mg | ORAL_TABLET | Freq: Every day | ORAL | Status: DC

## 2015-01-11 NOTE — Progress Notes (Signed)
Discharge instructions were gone over with the patient and the family.  Her telemetry box was taken off and her IV was D/C'd.  The patient was taken out with Rebecca Woodard NT via wheelchair and was taken home by her son.

## 2015-01-11 NOTE — Discharge Instructions (Signed)
Follow with Primary MD Rebecca Raider, MD in 7 days   Get CBC, CMP, 2 view Chest X ray checked  by Primary MD next visit.    Activity: As tolerated with Full fall precautions use walker/cane & assistance as needed   Disposition Home     Diet: Heart Healthy  with feeding assistance and aspiration precautions. For Heart failure patients - Check your Weight same time everyday, if you gain over 2 pounds, or you develop in leg swelling, experience more shortness of breath or chest pain, call your Primary MD immediately. Follow Cardiac Low Salt Diet and 1.5 lit/day fluid restriction.   On your next visit with your primary care physician please Get Medicines reviewed and adjusted.   Please request your Prim.MD to go over all Hospital Tests and Procedure/Radiological results at the follow up, please get all Hospital records sent to your Prim MD by signing hospital release before you go home.   If you experience worsening of your admission symptoms, develop shortness of breath, life threatening emergency, suicidal or homicidal thoughts you must seek medical attention immediately by calling 911 or calling your MD immediately  if symptoms less severe.  You Must read complete instructions/literature along with all the possible adverse reactions/side effects for all the Medicines you take and that have been prescribed to you. Take any new Medicines after you have completely understood and accpet all the possible adverse reactions/side effects.   Do not drive, operating heavy machinery, perform activities at heights, swimming or participation in water activities or provide baby sitting services if your were admitted for syncope or siezures until you have seen by Primary MD or a Neurologist and advised to do so again.  Do not drive when taking Pain medications.    Do not take more than prescribed Pain, Sleep and Anxiety Medications  Special Instructions: If you have smoked or chewed Tobacco  in the  last 2 yrs please stop smoking, stop any regular Alcohol  and or any Recreational drug use.  Wear Seat belts while driving.   Please note  You were cared for by a hospitalist during your hospital stay. If you have any questions about your discharge medications or the care you received while you were in the hospital after you are discharged, you can call the unit and asked to speak with the hospitalist on call if the hospitalist that took care of you is not available. Once you are discharged, your primary care physician will handle any further medical issues. Please note that NO REFILLS for any discharge medications will be authorized once you are discharged, as it is imperative that you return to your primary care physician (or establish a relationship with a primary care physician if you do not have one) for your aftercare needs so that they can reassess your need for medications and monitor your lab values.

## 2015-01-11 NOTE — Progress Notes (Signed)
  Echocardiogram 2D Echocardiogram has been performed.  Rebecca Woodard 01/11/2015, 10:33 AM 

## 2015-01-11 NOTE — Research (Signed)
Subject met criteria for the ReDS @ Discharge Research Study.  The study was discussed with the patient and her 2 daughters.  The patient and daughters declined participation.

## 2015-01-11 NOTE — Progress Notes (Signed)
Cardiovascular ultrasound was called concerning the patient's echo and the person speaking said that they would put the patient at the top of the list for this morning.

## 2015-01-11 NOTE — Discharge Summary (Signed)
Rebecca Woodard, is a 81 y.o. female  DOB 1922-12-16  MRN [id number].  Admission date:  01/09/2015  Admitting Physician  Rodolph Bong, MD  Discharge Date:  01/11/2015   Primary MD  Lupita Raider, MD  Recommendations for primary care physician for things to follow:   Check BMP in a week. Monitor weight and diuretic dose closely. Needs outpatient cardiology follow-up.   Admission Diagnosis  Acute on chronic diastolic congestive heart failure [I50.33]   Discharge Diagnosis  Acute on chronic diastolic congestive heart failure [I50.33]     Principal Problem:   Acute exacerbation of CHF (congestive heart failure) Active Problems:   HTN (hypertension)   GERD (gastroesophageal reflux disease)   Heart murmur   Hyperlipidemia   History of gallstones   Anemia   Abnormal urinalysis      Past Medical History  Diagnosis Date  . Acid reflux   . Gallstones   . Heart murmur   . Inner ear disease   . HTN (hypertension) 01/09/2015  . GERD (gastroesophageal reflux disease) 01/09/2015  . Hyperlipidemia 01/09/2015  . History of gallstones 01/09/2015  . Anemia 01/09/2015    History reviewed. No pertinent past surgical history.     HPI  from the history and physical done on the day of admission:    Rebecca Woodard is a 81 y.o. female  With history of hypertension, hyperlipidemia, heart murmur, gallstones, in the ear disease presented to the ED with a 2-3 week history of worsening shortness of breath on exertion occurring especially in the mornings when she wakes up in the mornings per her family. Family also reports physicians had some paroxysmal nocturnal dyspnea and questionable occasional orthopnea. Patient and family deny any chest pain. No fever, no chills. Family does endorse that patient gets nauseous and sometimes  with emesis when she eats and they feel by be secondary to a gallstones. Patient denies any constipation, no dysuria, no cough, no melanotic, no hematemesis, no hematochezia. Patient does states may have some occasional diarrhea however none at this time. Patient does endorse some generalized weakness. Patient was seen in the ED compressive metabolic profile.had a BUN of 21 creatinine of 1.09 glucose of 105 otherwise was within normal limits. BNP was 1937.2. Chest x-ray was consistent with CHF/volume overload. CBC had a hemoglobin of 10.7 platelet count of 147 otherwise was within normal limits. Urinalysis had trace leukocytes positive nitrite 3-6 WBCs. EKG had normal sinus rhythm with poor R-wave progression, prolonged QTC, prolonged PR interval. No ischemic changes noted.     Hospital Course:    1. Acute on chronic hypoxic respiratory failure secondary to acute on chronic CHF with likely underlying AS a stone exam. Echo noted which confirmed clinical suspicion of severe underlying AS. She is much improved after gentle diuresis, symptoms have completely resolved, off oxygen, no chest pain or discomfort. Continue Coreg, low-dose Lasix, continue aspirin. Family does not wish PT evaluation or any heroic measures which I think is appropriate considering her age. Will have her follow  outpatient with cardiology for age-appropriate follow-up.   2. Mild troponin rise. From demand ischemia due to #1 above. Chest pain-free, EKG nonacute. Troponin trend is not ACS. Outpatient cardiology follow-up for age-appropriate medical treatment. laced on aspirin, statin and beta blocker for secondary prevention.   3. Loud aortic systolic murmur. severe aortic stenosis confirmed on echoal management and outpatient cardiology follow-up.  4. Dyslipidemia. Continue home dose statin.  5. Borderline UA. No dysuria currently monitor. Follow urine cultures.  6. GERD. On PPI continue.  7. Essential hypertension. Stable on Coreg  and diuretic.  8. Hx of gallstones intermittent nausea and right upper quadrant pain chronically. No acute issue. The port of care with as needed nausea medications. She has been told before by general surgery that she is not operative candidate.  9. Chronic anemia. Follow with PCP.   Discharge Condition: Fair  Follow UP  Follow-up Information    Follow up with SHAW,KIMBERLEE, MD. Schedule an appointment as soon as possible for a visit in 1 week.   Specialty:  Family Medicine   Why:  OFFICE WILL CALL PATIENT AT HOME WITH APPOINTMENT   Contact information:   [address] [place] [address] [phone]       Follow up with [facility] [address] Schedule an appointment as soon as possible for a visit on 01/14/2015.   Why:  Appt. @ 9;30am with Cline Crock please arrive 15 miutes early   Contact information:   [address] [place] [address]        Consults obtained - None  Diet and Activity recommendation: See Discharge Instructions below  Discharge Instructions           Discharge Instructions    Discharge instructions    Complete by:  As directed   Follow with Primary MD SHAW,KIMBERLEE, MD in 7 days   Get CBC, CMP, 2 view Chest X ray checked  by Primary MD next visit.    Activity: As tolerated with Full fall precautions use walker/cane & assistance as needed   Disposition Home     Diet: Heart Healthy  with feeding assistance and aspiration precautions. For Heart failure patients - Check your Weight same time everyday, if you gain over 2 pounds, or you develop in leg swelling, experience more shortness of breath or chest pain, call your Primary MD immediately. Follow Cardiac Low Salt Diet and 1.5 lit/day fluid restriction.   On your next visit with your primary care physician please Get Medicines reviewed and adjusted.   Please request your Prim.MD to go over all Hospital Tests and Procedure/Radiological  results at the follow up, please get all Hospital records sent to your Prim MD by signing hospital release before you go home.   If you experience worsening of your admission symptoms, develop shortness of breath, life threatening emergency, suicidal or homicidal thoughts you must seek medical attention immediately by calling 911 or calling your MD immediately  if symptoms less severe.  You Must read complete instructions/literature along with all the possible adverse reactions/side effects for all the Medicines you take and that have been prescribed to you. Take any new Medicines after you have completely understood and accpet all the possible adverse reactions/side effects.   Do not drive, operating heavy machinery, perform activities at heights, swimming or participation in water activities or provide baby sitting services if your were admitted for syncope or siezures until you have seen by Primary MD or a  Neurologist and advised to do so again.  Do not drive when taking Pain medications.    Do not take more than prescribed Pain, Sleep and Anxiety Medications  Special Instructions: If you have smoked or chewed Tobacco  in the last 2 yrs please stop smoking, stop any regular Alcohol  and or any Recreational drug use.  Wear Seat belts while driving.   Please note  You were cared for by a hospitalist during your hospital stay. If you have any questions about your discharge medications or the care you received while you were in the hospital after you are discharged, you can call the unit and asked to speak with the hospitalist on call if the hospitalist that took care of you is not available. Once you are discharged, your primary care physician will handle any further medical issues. Please note that NO REFILLS for any discharge medications will be authorized once you are discharged, as it is imperative that you return to your primary care physician (or establish a relationship with a primary  care physician if you do not have one) for your aftercare needs so that they can reassess your need for medications and monitor your lab values.     Increase activity slowly    Complete by:  As directed              Discharge Medications       Medication List    STOP taking these medications        hydrochlorothiazide 25 MG tablet  Commonly known as:  HYDRODIURIL      TAKE these medications        aspirin 81 MG EC tablet  Take 1 tablet (81 mg total) by mouth daily.     carvedilol 3.125 MG tablet  Commonly known as:  COREG  Take 1 tablet (3.125 mg total) by mouth 2 (two) times daily with a meal.     dimenhyDRINATE 50 MG tablet  Commonly known as:  DRAMAMINE  Take 50 mg by mouth every 8 (eight) hours as needed.     furosemide 40 MG tablet  Commonly known as:  LASIX  Take 0.5 tablets (20 mg total) by mouth daily.     omeprazole 20 MG capsule  Commonly known as:  PRILOSEC  Take 20 mg by mouth 2 (two) times daily before a meal.     ondansetron 4 MG tablet  Commonly known as:  ZOFRAN  Take 1 tablet (4 mg total) by mouth every 8 (eight) hours as needed for nausea or vomiting.     simethicone 80 MG chewable tablet  Commonly known as:  MYLICON  Chew 80 mg by mouth every 6 (six) hours as needed for flatulence.     simvastatin 20 MG tablet  Commonly known as:  ZOCOR  Take 20 mg by mouth daily at 6 PM.        Major procedures and Radiology Reports - PLEASE review detailed and final reports for all details, in brief -     TTE   - Left ventricle: The cavity size was normal. Christiansen thickness wasincreased in a pattern of mild LVH. Systolic function was mildly reduced. The estimated ejection fraction was in the range of 45% to 50%. Hypokinesis of the basal-midanterolateral and inferolateral myocardium. There was an increased relative contribution of atrial contraction to ventricular filling. Doppler parameters are consistent with elevated mean left atrial filling  pressure. - Aortic valve: There was severe stenosis. There was moderate to severe  regurgitation. Valve area (VTI): 0.43 cm^2. Valve area (Vmax): 0.43 cm^2. Valve area (Vmean): 0.39 cm^2. - Mitral valve: Calcified annulus. Mildly thickened leaflets . There was mild regurgitation. Diastolic regurgitation was present, consistent with first degree AV block. - Left atrium: The atrium was mildly dilated. - Tricuspid valve: There was mild-moderate regurgitation directed centrally. Diastolic regurgitation was present. - Pulmonary arteries: Systolic pressure was mildly increased. PA peak pressure: 37 mm Hg (S).  Dg Chest 2 View  01/09/2015   CLINICAL DATA:  Shortness of breath for the last few weeks, worsening  EXAM: CHEST  2 VIEW  COMPARISON:  None.  FINDINGS: CHF pattern with diffuse interstitial opacity and small bilateral pleural effusions. Atypical infection could have this appearance but there is no indication of infectious symptoms per report and the heart and vascular pedicle are enlarged.  IMPRESSION: CHF pattern.   Electronically Signed   By: Marnee Spring M.D.   On: 01/09/2015 10:44    Micro Results      No results found for this or any previous visit (from the past 240 hour(s)).     Today   Subjective    Rebecca Woodard today has no headache,no chest abdominal pain,no new weakness tingling or numbness, feels much better wants to go home today.     Objective   Blood pressure 107/54, pulse 61, temperature 98.1 F (36.7 C), temperature source Oral, resp. rate 18, height 5\' 5"  (1.651 m), weight 76.885 kg (169 lb 8 oz), SpO2 94 %.   Intake/Output Summary (Last 24 hours) at 01/11/15 1247 Last data filed at 01/11/15 0942  Gross per 24 hour  Intake    960 ml  Output    753 ml  Net    207 ml    Exam Awake Alert, Oriented x 3, No new F.N deficits, Normal affect [place].AT,PERRAL Supple Neck,No JVD, No cervical lymphadenopathy appriciated.  Symmetrical Chest Demeyer movement, Good air  movement bilaterally, CTAB RRR,No Gallops,Rubs, loud aortic systolic murmur, No Parasternal Heave +ve B.Sounds, Abd Soft, Non tender, No organomegaly appriciated, No rebound -guarding or rigidity. No Cyanosis, Clubbing or edema, No new Rash or bruise   Data Review   CBC w Diff:  Lab Results  Component Value Date   WBC 8.1 01/10/2015   HGB 10.9* 01/10/2015   HCT 34.7* 01/10/2015   PLT 121* 01/10/2015   LYMPHOPCT 49* 01/09/2015   MONOPCT 5 01/09/2015   EOSPCT 1 01/09/2015   BASOPCT 0 01/09/2015    CMP:  Lab Results  Component Value Date   NA 134* 01/11/2015   K 4.1 01/11/2015   CL 99* 01/11/2015   CO2 28 01/11/2015   BUN 30* 01/11/2015   CREATININE 1.24* 01/11/2015   PROT 6.9 01/09/2015   ALBUMIN 3.8 01/09/2015   BILITOT 0.8 01/09/2015   ALKPHOS 66 01/09/2015   AST 21 01/09/2015   ALT 16 01/09/2015  .   Total Time in preparing paper work, data evaluation and todays exam - 35 minutes  Leroy Sea M.D on 01/11/2015 at 12:47 PM  [facility]   Office  [phone]

## 2015-01-12 LAB — URINE CULTURE: Culture: >=100000

## 2015-01-14 ENCOUNTER — Ambulatory Visit (INDEPENDENT_AMBULATORY_CARE_PROVIDER_SITE_OTHER): Payer: Medicare Other | Admitting: Physician Assistant

## 2015-01-14 ENCOUNTER — Encounter: Payer: Self-pay | Admitting: Physician Assistant

## 2015-01-14 VITALS — BP 142/82 | HR 74 | Ht 65.0 in | Wt 171.2 lb

## 2015-01-14 DIAGNOSIS — I5040 Unspecified combined systolic (congestive) and diastolic (congestive) heart failure: Secondary | ICD-10-CM | POA: Diagnosis not present

## 2015-01-14 LAB — BASIC METABOLIC PANEL
BUN: 34 mg/dL — AB (ref 6–23)
CO2: 31 meq/L (ref 19–32)
CREATININE: 1.15 mg/dL (ref 0.40–1.20)
Calcium: 9.3 mg/dL (ref 8.4–10.5)
Chloride: 99 mEq/L (ref 96–112)
GFR: 47.08 mL/min — ABNORMAL LOW (ref >60.00)
GLUCOSE: 92 mg/dL (ref 70–99)
Potassium: 4.1 mEq/L (ref 3.5–5.1)
Sodium: 138 mEq/L (ref 135–145)

## 2015-01-14 NOTE — Patient Instructions (Signed)
Medication Instructions:  Your physician recommends that you continue on your current medications as directed. Please refer to the Current Medication list given to you today.   Labwork: BMET : TODAY  Testing/Procedures: None ordered  Follow-Up: Your physician wants you to follow-up in: 6 MONTHS WITH DR. Eden Emms.  You will receive a reminder letter in the mail two months in advance. If you don't receive a letter, please call our office to schedule the follow-up appointment.  Call us for a sooner appointment if you begin to have symptoms.   Any Other Special Instructions Will Be Listed Below (If Applicable).

## 2015-01-14 NOTE — Progress Notes (Signed)
Cardiology Office Note    Date:  01/14/2015   ID:  Rebecca Woodard, DOB 28-Dec-1922, MRN [id number]  PCP:  Lupita Raider, MD  Cardiologist:  New (Dr. Eden Emms)     History of Present Illness: Rebecca Woodard is a 81 y.o. female HTN, HLD, severe AS, and chronic mixed systolic and diastolic CHF who presents for post hospital follow up.  She was recently admitted to [facility] from 01/09/15-01/11/15 for acute on chronic CHF. Upon arrival to the ED she was noted to have a mildly elevated BNP, pulmonary edema on CXR and mildly elevated troponin felt to be demand ischemia in the setting of acute CHF. A loud murmur was heard on physical exam and 2D ECHO confirmed suspicion of severe aortic stenosis. 2D ECHO report below. She was admitted for gentle diuresis and her symptoms completely resolved. Her discharge weight was 169 lbs. She was discharged on lasix 20mg  po qd. Since discharge she has been doing well in terms of breathing. No CP, orhopnea, PND or LE edema. No lightheadedness, dizziness or syncope.   She does complain of ear pain and some nausea that is partially relieved by pepto bismol.   Studies: - 2D ECHO: 01/11/2015 LV EF: 45%- 50% Study Conclusions - Left ventricle: The cavity size was normal. [place] thickness was   increased in a pattern of mild LVH. Systolic function was mildly   reduced. The estimated ejection fraction was in the range of 45%   to 50%. Hypokinesis of the basal-midanterolateral and   inferolateral myocardium. There was an increased relative   contribution of atrial contraction to ventricular filling.   Doppler parameters are consistent with elevated mean left atrial   filling pressure. - Aortic valve: There was severe stenosis. There was moderate to   severe regurgitation. Valve area (VTI): 0.43 cm^2. Valve area   (Vmax): 0.43 cm^2. Valve area (Vmean): 0.39 cm^2. - Mitral valve: Calcified annulus. Mildly thickened leaflets .   There was mild regurgitation. Diastolic  regurgitation was   present, consistent with first degree AV block. - Left atrium: The atrium was mildly dilated. - Tricuspid valve: There was mild-moderate regurgitation directed   centrally. Diastolic regurgitation was present. - Pulmonary arteries: Systolic pressure was mildly increased. PA   peak pressure: 37 mm Hg (S).   Recent Labs/Images:   Recent Labs  01/09/15 1110 01/09/15 1353 01/10/15 0048 01/10/15 0647 01/11/15 0442  NA 138  --   --  140 134*  K 4.6  --   --  4.1 4.1  BUN 21*  --   --  23* 30*  CREATININE 1.09*  --   --  1.12* 1.24*  ALT 16  --   --   --   --   HGB 10.7*  --   --  10.9*  --   TSH  --  3.757  --   --   --   LDLCALC  --   --  144*  --   --   HDL  --   --  49  --   --   BNP 1937.2*  --   --   --   --      Dg Chest 2 View  01/09/2015   CLINICAL DATA:  Shortness of breath for the last few weeks, worsening  EXAM: CHEST  2 VIEW  COMPARISON:  None.  FINDINGS: CHF pattern with diffuse interstitial opacity and small bilateral pleural effusions. Atypical infection could have this appearance but there is no  indication of infectious symptoms per report and the heart and vascular pedicle are enlarged.  IMPRESSION: CHF pattern.   Electronically Signed   By: Marnee Spring M.D.   On: 01/09/2015 10:44     Wt Readings from Last 3 Encounters:  01/11/15 169 lb 8 oz (76.885 kg)     Past Medical History  Diagnosis Date  . Acid reflux   . Gallstones   . Heart murmur   . Inner ear disease   . HTN (hypertension) 01/09/2015  . GERD (gastroesophageal reflux disease) 01/09/2015  . Hyperlipidemia 01/09/2015  . History of gallstones 01/09/2015  . Anemia 01/09/2015    Current Outpatient Prescriptions  Medication Sig Dispense Refill  . aspirin EC 81 MG EC tablet Take 1 tablet (81 mg total) by mouth daily. 30 tablet 0  . carvedilol (COREG) 3.125 MG tablet Take 1 tablet (3.125 mg total) by mouth 2 (two) times daily with a meal. 60 tablet 0  . dimenhyDRINATE (DRAMAMINE)  50 MG tablet Take 50 mg by mouth every 8 (eight) hours as needed.    . furosemide (LASIX) 40 MG tablet Take 0.5 tablets (20 mg total) by mouth daily. 30 tablet 0  . omeprazole (PRILOSEC) 20 MG capsule Take 20 mg by mouth 2 (two) times daily before a meal.    . ondansetron (ZOFRAN) 4 MG tablet Take 1 tablet (4 mg total) by mouth every 8 (eight) hours as needed for nausea or vomiting. 20 tablet 0  . simethicone (MYLICON) 80 MG chewable tablet Chew 80 mg by mouth every 6 (six) hours as needed for flatulence.    . simvastatin (ZOCOR) 20 MG tablet Take 20 mg by mouth daily at 6 PM.     No current facility-administered medications for this visit.     Allergies:   Salicylates   Social History:  The patient  reports that she has never smoked. She does not have any smokeless tobacco history on file. She reports that she does not drink alcohol or use illicit drugs.   Family History:  The patient's family history is not on file. ROS:  Please see the history of present illness.    All other systems reviewed and negative.    PHYSICAL EXAM: VS:  There were no vitals taken for this visit. Well nourished, well developed, in no acute distress HEENT: normal Neck: no JVD Cardiac:  normal S1, S2; RRR; Loud SEM loudest at the RUSB Lungs:  clear to auscultation bilaterally, no wheezing, rhonchi or rales Abd: soft, nontender, no hepatomegaly Ext: Trace LE edema bilaterally Skin: warm and dry Neuro:  CNs 2-12 intact, no focal abnormalities noted  EKG:  none   ASSESSMENT AND PLAN:  Rebecca Woodard is a 81 y.o. female HTN, HLD, severe AS, and chronic mixed systolic and diastolic CHF who presents for post hospital follow up.  Chronic combined systolic and diastolic CHF- now resolved. Mild systolic dysfunction w/ EF 45-50%. Discharged on  qd. Weight is stable at 171.2 lbs today. No s/s CHF. She appear euvolemic. Continue lasix and will get a BMET today. -- Continue Coreg 3.124mg  BID  -- We went over  dietary restrictions and daily weights. She knows to call if she gains more than 3 lbs in one day or 5 lbs in 1 week.   AS- There was severe stenosis by 2D ECHO. There was moderate to   severe regurgitation. Valve area (VTI): 0.43 cm^2. Valve area   (Vmax): 0.43 cm^2. Valve area (Vmean): 0.39 cm^2. --  No sx currently. She is not a candidate for AVR or TAVR. Will continue to treat medically.  HTN- continue Coreg 3.124mg  BID and Lasix 20mg  qd. Well controlled today 142/82  Left Ear pain- reccommended that she follow up with PCP  Disposition:  FU with Dr. Eden Emms in 6 months or sooner if she has any issues.   Patient was seen by Dr Eden Emms during her new patient visit.   Signed, Eustace Pen, PA-C, MHS 01/14/2015 8:51 AM    [facility] [address] [place], [place], [place]  [address] Phone: [phone]; Fax: [phone]

## 2015-02-24 ENCOUNTER — Emergency Department (HOSPITAL_COMMUNITY): Payer: Medicare Other

## 2015-02-24 ENCOUNTER — Observation Stay (HOSPITAL_COMMUNITY)
Admission: EM | Admit: 2015-02-24 | Discharge: 2015-02-25 | Disposition: A | Discharge status: Home / Self Care | Payer: Medicare Other | Attending: Cardiovascular Disease | Admitting: Cardiovascular Disease

## 2015-02-24 ENCOUNTER — Encounter (HOSPITAL_COMMUNITY): Payer: Self-pay | Admitting: Emergency Medicine

## 2015-02-24 DIAGNOSIS — I10 Essential (primary) hypertension: Secondary | ICD-10-CM | POA: Diagnosis not present

## 2015-02-24 DIAGNOSIS — I5033 Acute on chronic diastolic (congestive) heart failure: Secondary | ICD-10-CM | POA: Diagnosis not present

## 2015-02-24 DIAGNOSIS — B9620 Unspecified Escherichia coli [E. coli] as the cause of diseases classified elsewhere: Secondary | ICD-10-CM | POA: Insufficient documentation

## 2015-02-24 DIAGNOSIS — I509 Heart failure, unspecified: Secondary | ICD-10-CM

## 2015-02-24 DIAGNOSIS — Z7982 Long term (current) use of aspirin: Secondary | ICD-10-CM | POA: Diagnosis not present

## 2015-02-24 DIAGNOSIS — I38 Endocarditis, valve unspecified: Secondary | ICD-10-CM | POA: Diagnosis present

## 2015-02-24 DIAGNOSIS — N6489 Other specified disorders of breast: Secondary | ICD-10-CM | POA: Diagnosis not present

## 2015-02-24 DIAGNOSIS — I350 Nonrheumatic aortic (valve) stenosis: Secondary | ICD-10-CM

## 2015-02-24 DIAGNOSIS — E785 Hyperlipidemia, unspecified: Secondary | ICD-10-CM | POA: Diagnosis present

## 2015-02-24 DIAGNOSIS — Z79899 Other long term (current) drug therapy: Secondary | ICD-10-CM | POA: Diagnosis not present

## 2015-02-24 DIAGNOSIS — N3000 Acute cystitis without hematuria: Secondary | ICD-10-CM | POA: Diagnosis not present

## 2015-02-24 DIAGNOSIS — N390 Urinary tract infection, site not specified: Secondary | ICD-10-CM | POA: Insufficient documentation

## 2015-02-24 LAB — CBC WITH DIFFERENTIAL/PLATELET
BASOS ABS: 0 10*3/uL (ref 0.0–0.1)
BASOS PCT: 0 %
EOS ABS: 0.1 10*3/uL (ref 0.0–0.7)
Eosinophils Relative: 1 %
HCT: 33.6 % — ABNORMAL LOW (ref 36.0–46.0)
HEMOGLOBIN: 10.2 g/dL — AB (ref 12.0–15.0)
Lymphocytes Relative: 49 %
Lymphs Abs: 4.7 10*3/uL — ABNORMAL HIGH (ref 0.7–4.0)
MCH: 28.3 pg (ref 26.0–34.0)
MCHC: 30.4 g/dL (ref 30.0–36.0)
MCV: 93.1 fL (ref 78.0–100.0)
Monocytes Absolute: 0.3 10*3/uL (ref 0.1–1.0)
Monocytes Relative: 4 %
NEUTROS PCT: 46 %
Neutro Abs: 4.4 10*3/uL (ref 1.7–7.7)
Platelets: 173 10*3/uL (ref 150–400)
RBC: 3.61 MIL/uL — AB (ref 3.87–5.11)
RDW: 17.3 % — ABNORMAL HIGH (ref 11.5–15.5)
WBC: 9.6 10*3/uL (ref 4.0–10.5)

## 2015-02-24 LAB — URINE MICROSCOPIC-ADD ON

## 2015-02-24 LAB — URINALYSIS, ROUTINE W REFLEX MICROSCOPIC
BILIRUBIN URINE: NEGATIVE
GLUCOSE, UA: NEGATIVE mg/dL
Ketones, ur: NEGATIVE mg/dL
Nitrite: NEGATIVE
PH: 5 (ref 5.0–8.0)
Protein, ur: NEGATIVE mg/dL
SPECIFIC GRAVITY, URINE: 1.007 (ref 1.005–1.030)
UROBILINOGEN UA: 0.2 mg/dL (ref 0.0–1.0)

## 2015-02-24 LAB — COMPREHENSIVE METABOLIC PANEL
ALBUMIN: 3.5 g/dL (ref 3.5–5.0)
ALK PHOS: 83 U/L (ref 38–126)
ALT: 15 U/L (ref 14–54)
AST: 20 U/L (ref 15–41)
Anion gap: 8 (ref 5–15)
BUN: 19 mg/dL (ref 6–20)
CHLORIDE: 102 mmol/L (ref 101–111)
CO2: 27 mmol/L (ref 22–32)
CREATININE: 1.06 mg/dL — AB (ref 0.44–1.00)
Calcium: 8.8 mg/dL — ABNORMAL LOW (ref 8.9–10.3)
GFR calc Af Amer: 52 mL/min — ABNORMAL LOW (ref >60)
GFR, EST NON AFRICAN AMERICAN: 45 mL/min — AB (ref >60)
Glucose, Bld: 124 mg/dL — ABNORMAL HIGH (ref 65–99)
Potassium: 4.8 mmol/L (ref 3.5–5.1)
SODIUM: 137 mmol/L (ref 135–145)
Total Bilirubin: 0.6 mg/dL (ref 0.3–1.2)
Total Protein: 6.6 g/dL (ref 6.5–8.1)

## 2015-02-24 LAB — BRAIN NATRIURETIC PEPTIDE: B Natriuretic Peptide: 2269.9 pg/mL — ABNORMAL HIGH (ref 0.0–100.0)

## 2015-02-24 LAB — TROPONIN I: TROPONIN I: 0.03 ng/mL (ref <0.031)

## 2015-02-24 LAB — I-STAT CG4 LACTIC ACID, ED: Lactic Acid, Venous: 0.47 mmol/L — ABNORMAL LOW (ref 0.5–2.0)

## 2015-02-24 MED ORDER — NITROFURANTOIN MONOHYD MACRO 100 MG PO CAPS
100.0000 mg | ORAL_CAPSULE | Freq: Two times a day (BID) | ORAL | Status: DC
Start: 2015-02-24 — End: 2015-02-24

## 2015-02-24 MED ORDER — ASPIRIN EC 81 MG PO TBEC
81.0000 mg | DELAYED_RELEASE_TABLET | Freq: Every day | ORAL | Status: DC
  Administered 2015-02-24 – 2015-02-25 (×2): 81 mg via ORAL
  Filled 2015-02-24 (×2): qty 1

## 2015-02-24 MED ORDER — DIMENHYDRINATE 50 MG PO TABS
50.0000 mg | ORAL_TABLET | Freq: Three times a day (TID) | ORAL | Status: DC | PRN
  Administered 2015-02-25: 50 mg via ORAL
  Filled 2015-02-24 (×3): qty 1

## 2015-02-24 MED ORDER — SIMVASTATIN 20 MG PO TABS
20.0000 mg | ORAL_TABLET | Freq: Every day | ORAL | Status: DC
  Administered 2015-02-24: 20 mg via ORAL
  Filled 2015-02-24: qty 1

## 2015-02-24 MED ORDER — SODIUM CHLORIDE 0.9 % IV SOLN: 250.0000 mL | INTRAVENOUS | Status: DC | PRN

## 2015-02-24 MED ORDER — ALBUTEROL SULFATE (2.5 MG/3ML) 0.083% IN NEBU
INHALATION_SOLUTION | RESPIRATORY_TRACT | Status: AC
  Administered 2015-02-24: 5 mg
  Filled 2015-02-24: qty 3

## 2015-02-24 MED ORDER — CARVEDILOL 3.125 MG PO TABS
3.1250 mg | ORAL_TABLET | Freq: Two times a day (BID) | ORAL | Status: DC
  Administered 2015-02-24 – 2015-02-25 (×2): 3.125 mg via ORAL
  Filled 2015-02-24 (×2): qty 1

## 2015-02-24 MED ORDER — ALUM & MAG HYDROXIDE-SIMETH 200-200-20 MG/5ML PO SUSP
30.0000 mL | ORAL | Status: DC | PRN
  Administered 2015-02-24: 30 mL via ORAL
  Filled 2015-02-24: qty 30

## 2015-02-24 MED ORDER — ONDANSETRON HCL 4 MG/2ML IJ SOLN: 4.0000 mg | Freq: Four times a day (QID) | INTRAMUSCULAR | Status: DC | PRN

## 2015-02-24 MED ORDER — NITROFURANTOIN MONOHYD MACRO 100 MG PO CAPS
100.0000 mg | ORAL_CAPSULE | Freq: Two times a day (BID) | ORAL | Status: DC
  Administered 2015-02-24 – 2015-02-25 (×3): 100 mg via ORAL
  Filled 2015-02-24 (×6): qty 1

## 2015-02-24 MED ORDER — CEPHALEXIN 500 MG PO CAPS
500.0000 mg | ORAL_CAPSULE | Freq: Two times a day (BID) | ORAL | Status: DC
  Administered 2015-02-24 – 2015-02-25 (×3): 500 mg via ORAL
  Filled 2015-02-24 (×4): qty 1

## 2015-02-24 MED ORDER — SODIUM CHLORIDE 0.9 % IJ SOLN
3.0000 mL | Freq: Two times a day (BID) | INTRAMUSCULAR | Status: DC
  Administered 2015-02-24 – 2015-02-25 (×3): 3 mL via INTRAVENOUS

## 2015-02-24 MED ORDER — SODIUM CHLORIDE 0.9 % IJ SOLN: 3.0000 mL | INTRAMUSCULAR | Status: DC | PRN

## 2015-02-24 MED ORDER — PANTOPRAZOLE SODIUM 40 MG PO TBEC
40.0000 mg | DELAYED_RELEASE_TABLET | Freq: Every day | ORAL | Status: DC
  Administered 2015-02-24 – 2015-02-25 (×2): 40 mg via ORAL
  Filled 2015-02-24 (×2): qty 1

## 2015-02-24 MED ORDER — HEPARIN SODIUM (PORCINE) 5000 UNIT/ML IJ SOLN
5000.0000 [IU] | Freq: Three times a day (TID) | INTRAMUSCULAR | Status: DC
  Administered 2015-02-24 – 2015-02-25 (×4): 5000 [IU] via SUBCUTANEOUS
  Filled 2015-02-24 (×5): qty 1

## 2015-02-24 MED ORDER — FUROSEMIDE 10 MG/ML IJ SOLN
40.0000 mg | Freq: Two times a day (BID) | INTRAMUSCULAR | Status: AC
  Administered 2015-02-24 – 2015-02-25 (×2): 40 mg via INTRAVENOUS
  Filled 2015-02-24 (×2): qty 4

## 2015-02-24 MED ORDER — FUROSEMIDE 10 MG/ML IJ SOLN
40.0000 mg | Freq: Once | INTRAMUSCULAR | Status: AC
  Administered 2015-02-24: 40 mg via INTRAVENOUS
  Filled 2015-02-24: qty 4

## 2015-02-24 MED ORDER — ACETAMINOPHEN 325 MG PO TABS
650.0000 mg | ORAL_TABLET | ORAL | Status: DC | PRN
  Administered 2015-02-24 – 2015-02-25 (×2): 650 mg via ORAL
  Filled 2015-02-24 (×2): qty 2

## 2015-02-24 NOTE — ED Notes (Signed)
From home, c/o SOB onset this AM, hx CHF, denies lung disease, is speaking in partial sentences on arrival, mild resp distress, denies pain, A/O X4

## 2015-02-24 NOTE — Progress Notes (Signed)
Pt has boil on right breast and area around boil is warm and reddened, area marked with Sharpie, VSS, pt stable

## 2015-02-24 NOTE — ED Provider Notes (Signed)
CSN: [id number]     Arrival date & time 02/24/15  0730 History   First MD Initiated Contact with Patient 02/24/15 [phone]     Chief Complaint  Patient presents with  . Congestive Heart Failure     (Consider location/radiation/quality/duration/timing/severity/associated sxs/prior Treatment) Patient is a 81 y.o. female presenting with shortness of breath.  Shortness of Breath Severity:  Moderate Onset quality:  Sudden Duration:  1 hour Timing:  Constant Progression:  Unchanged Chronicity:  Recurrent Context comment:  Recently hospitalized for CHF.  Pt reports feeling fine yesterday, but getting acutely short of breath this AM after walking to the Kitchen Relieved by:  Rest and oxygen Worsened by:  Activity Associated symptoms: no chest pain, no diaphoresis, no fever and no vomiting   Associated symptoms comment:  No nausea   Past Medical History  Diagnosis Date  . Acid reflux   . Gallstones   . Heart murmur   . Inner ear disease   . HTN (hypertension) 01/09/2015  . GERD (gastroesophageal reflux disease) 01/09/2015  . Hyperlipidemia 01/09/2015  . History of gallstones 01/09/2015  . Anemia 01/09/2015   No past surgical history on file. Family History  Problem Relation Age of Onset  . Heart failure Mother   . Dementia Father   . Heart attack Father    Social History  Substance Use Topics  . Smoking status: Never Smoker   . Smokeless tobacco: Not on file  . Alcohol Use: No   OB History    No data available     Review of Systems  Constitutional: Negative for fever and diaphoresis.  Respiratory: Positive for shortness of breath.   Cardiovascular: Negative for chest pain.  Gastrointestinal: Negative for vomiting.  All other systems reviewed and are negative.     Allergies  Salicylates  Home Medications   Prior to Admission medications   Medication Sig Start Date End Date Taking? Authorizing Provider  aspirin EC 81 MG EC tablet Take 1 tablet (81 mg total) by  mouth daily. 01/11/15   Leroy Sea, MD  carvedilol (COREG) 3.125 MG tablet Take 1 tablet (3.125 mg total) by mouth 2 (two) times daily with a meal. 01/11/15   Leroy Sea, MD  dimenhyDRINATE (DRAMAMINE) 50 MG tablet Take 50 mg by mouth every 8 (eight) hours as needed.    Historical Provider, MD  furosemide (LASIX) 40 MG tablet Take 0.5 tablets (20 mg total) by mouth daily. 01/11/15   Leroy Sea, MD  hydrochlorothiazide (HYDRODIURIL) 25 MG tablet Take 25 mg by mouth 2 (two) times daily. 01/06/15   Historical Provider, MD  omeprazole (PRILOSEC) 20 MG capsule Take 20 mg by mouth 2 (two) times daily before a meal.    Historical Provider, MD  ondansetron (ZOFRAN) 4 MG tablet Take 1 tablet (4 mg total) by mouth every 8 (eight) hours as needed for nausea or vomiting. 01/11/15   Leroy Sea, MD  simethicone (MYLICON) 80 MG chewable tablet Chew 80 mg by mouth every 6 (six) hours as needed for flatulence.    Historical Provider, MD  simvastatin (ZOCOR) 20 MG tablet Take 20 mg by mouth daily at 6 PM.    Historical Provider, MD   There were no vitals taken for this visit. Physical Exam  Constitutional: She is oriented to person, place, and time. She appears well-developed and well-nourished. No distress.  HENT:  Head: Normocephalic and atraumatic.  Mouth/Throat: Oropharynx is clear and moist.  Eyes: Conjunctivae are normal. Pupils are  equal, round, and reactive to light. No scleral icterus.  Neck: Neck supple.  Cardiovascular: Normal rate, regular rhythm and intact distal pulses.   Murmur heard.  Systolic murmur is present with a grade of 3/6  Pulmonary/Chest: Effort normal and breath sounds normal. No stridor. Tachypnea noted. No respiratory distress.  Bibasilar crackles  Abdominal: Soft. Bowel sounds are normal. She exhibits no distension. There is no tenderness.  Musculoskeletal: Normal range of motion.  Neurological: She is alert and oriented to person, place, and time.  Skin: Skin  is warm and dry. No rash noted.  Psychiatric: She has a normal mood and affect. Her behavior is normal.  Nursing note and vitals reviewed.   ED Course  Procedures (including critical care time) Labs Review Labs Reviewed  CBC WITH DIFFERENTIAL/PLATELET - Abnormal; Notable for the following:    RBC 3.61 (*)    Hemoglobin 10.2 (*)    HCT 33.6 (*)    RDW 17.3 (*)    Lymphs Abs 4.7 (*)    All other components within normal limits  COMPREHENSIVE METABOLIC PANEL - Abnormal; Notable for the following:    Glucose, Bld 124 (*)    Creatinine, Ser 1.06 (*)    Calcium 8.8 (*)    GFR calc non Af Amer 45 (*)    GFR calc Af Amer 52 (*)    All other components within normal limits  BRAIN NATRIURETIC PEPTIDE - Abnormal; Notable for the following:    B Natriuretic Peptide 2269.9 (*)    All other components within normal limits  I-STAT CG4 LACTIC ACID, ED - Abnormal; Notable for the following:    Lactic Acid, Venous 0.47 (*)    All other components within normal limits  TROPONIN I  URINALYSIS, ROUTINE W REFLEX MICROSCOPIC (NOT AT [facility])    Imaging Review Dg Chest Port 1 View  02/24/2015  CLINICAL DATA:  Shortness of breath. EXAM: PORTABLE CHEST 1 VIEW COMPARISON:  January 09, 2015. FINDINGS: Stable cardiomegaly. Mild central pulmonary vascular congestion is noted. No pneumothorax is noted. Narrowing of right subacromial space is noted suggesting rotator cuff injury. Increased right basilar opacity is noted concerning for worsening atelectasis, edema or pneumonia with associated minimal pleural effusion. Increased left basilar opacity is noted most consistent with edema or subsegmental atelectasis. IMPRESSION: Mild central pulmonary vascular congestion. Mildly increased left basilar opacity is noted concerning for subsegmental atelectasis or possibly edema. Increased right basilar opacity is noted concerning for worsening atelectasis, edema or pneumonia with associated minimal pleural effusion.  Electronically Signed   By: Lupita RaiderJames  Green Jr, M.D.   On: 02/24/2015 08:05   I have personally reviewed and evaluated these images and lab results as part of my medical decision-making.   EKG Interpretation   Date/Time:  Wednesday February 24 2015 07:38:39 EDT Ventricular Rate:  92 PR Interval:  249 QRS Duration: 107 QT Interval:  382 QTC Calculation: 473 R Axis:   -33 Text Interpretation:  Sinus rhythm Prolonged PR interval Consider left  atrial enlargement Left axis deviation Repol abnrm suggests ischemia,  diffuse leads nonspecific ST abnormality increased and axis more left  compared to prior. Confirmed by [facility]  MD, TREY ([id number]) on 02/24/2015  8:32:07 AM      MDM   Final diagnoses:  Acute on chronic congestive heart failure, unspecified congestive heart failure type (HCC)    Admit for acute on chronic CHF.  Lasix IV given.      Blake DivineJohn Marquesa Rath, MD 02/24/15 1113

## 2015-02-24 NOTE — H&P (Signed)
Rebecca Woodard is an 81 y.o. female.   Chief Complaint: SOB HPI:   Rebecca Woodard is a 81 y.o. female HTN, HLD, severe AS, and chronic mixed systolic and diastolic CHF.  She was admitted to [facility] from 01/09/15-01/11/15 for acute on chronic CHF. Upon arrival to the ED she was noted to have a mildly elevated BNP, pulmonary edema on CXR and mildly elevated troponin felt to be demand ischemia in the setting of acute CHF. A loud murmur was heard on physical exam and 2D ECHO confirmed suspicion of severe aortic stenosis (mean aortic valve area 0.39 cm).  Her ejection fraction was 45-50% with hypokinesis of the basal mid anterior lateral and inferior lateral myocardium. She was admitted for gentle diuresis and her symptoms completely resolved. Her discharge weight was 169 lbs. She was discharged on lasix 78m po qd.   Patient presents today with SOB.  She's been having some shortness of breath in the morning right after waking, however, after about 30 minutes it resolves and she doesn't have any problems during the day. This morning however, it did not resolve and was worse than prior days. Her weight is up a pound yesterday.  Her scale read 173 pounds.  She otherwise denies nausea, vomiting, fever, chest pain,   dizziness, PND, cough, congestion, abdominal pain, hematochezia, melena, lower extremity edema.    Past Medical History  Diagnosis Date  . Acid reflux   . Gallstones   . Heart murmur   . Inner ear disease   . HTN (hypertension) 01/09/2015  . GERD (gastroesophageal reflux disease) 01/09/2015  . Hyperlipidemia 01/09/2015  . History of gallstones 01/09/2015  . Anemia 01/09/2015    History reviewed. No pertinent past surgical history.  Family History  Problem Relation Age of Onset  . Heart failure Mother   . Dementia Father   . Heart attack Father    Social History:  reports that she has never smoked. She does not have any smokeless tobacco history on file. She reports that she does not drink  alcohol or use illicit drugs.  Allergies:  Allergies  Allergen Reactions  . Amoxicillin Other (See Comments)    Intolerance per son  . Salicylates     All nsaids     (Not in a hospital admission)  Results for orders placed or performed during the hospital encounter of 02/24/15 (from the past 48 hour(s))  CBC with Differential/Platelet     Status: Abnormal   Collection Time: 02/24/15  8:00 AM  Result Value Ref Range   WBC 9.6 4.0 - 10.5 K/uL   RBC 3.61 (L) 3.87 - 5.11 MIL/uL   Hemoglobin 10.2 (L) 12.0 - 15.0 g/dL   HCT 33.6 (L) 36.0 - 46.0 %   MCV 93.1 78.0 - 100.0 fL   MCH 28.3 26.0 - 34.0 pg   MCHC 30.4 30.0 - 36.0 g/dL   RDW 17.3 (H) 11.5 - 15.5 %   Platelets 173 150 - 400 K/uL   Neutrophils Relative % 46 %   Neutro Abs 4.4 1.7 - 7.7 K/uL   Lymphocytes Relative 49 %   Lymphs Abs 4.7 (H) 0.7 - 4.0 K/uL   Monocytes Relative 4 %   Monocytes Absolute 0.3 0.1 - 1.0 K/uL   Eosinophils Relative 1 %   Eosinophils Absolute 0.1 0.0 - 0.7 K/uL   Basophils Relative 0 %   Basophils Absolute 0.0 0.0 - 0.1 K/uL  Comprehensive metabolic panel     Status: Abnormal  Collection Time: 02/24/15  8:00 AM  Result Value Ref Range   Sodium 137 135 - 145 mmol/L   Potassium 4.8 3.5 - 5.1 mmol/L   Chloride 102 101 - 111 mmol/L   CO2 27 22 - 32 mmol/L   Glucose, Bld 124 (H) 65 - 99 mg/dL   BUN 19 6 - 20 mg/dL   Creatinine, Ser 1.06 (H) 0.44 - 1.00 mg/dL   Calcium 8.8 (L) 8.9 - 10.3 mg/dL   Total Protein 6.6 6.5 - 8.1 g/dL   Albumin 3.5 3.5 - 5.0 g/dL   AST 20 15 - 41 U/L   ALT 15 14 - 54 U/L   Alkaline Phosphatase 83 38 - 126 U/L   Total Bilirubin 0.6 0.3 - 1.2 mg/dL   GFR calc non Af Amer 45 (L) >60 mL/min   GFR calc Af Amer 52 (L) >60 mL/min    Comment: (NOTE) The eGFR has been calculated using the CKD EPI equation. This calculation has not been validated in all clinical situations. eGFR's persistently <60 mL/min signify possible Chronic Kidney Disease.    Anion gap 8 5 - 15    Troponin I     Status: None   Collection Time: 02/24/15  8:00 AM  Result Value Ref Range   Troponin I 0.03 <0.031 ng/mL    Comment:        NO INDICATION OF MYOCARDIAL INJURY.   Brain natriuretic peptide     Status: Abnormal   Collection Time: 02/24/15  8:00 AM  Result Value Ref Range   B Natriuretic Peptide 2269.9 (H) 0.0 - 100.0 pg/mL  I-Stat CG4 Lactic Acid, ED     Status: Abnormal   Collection Time: 02/24/15  8:01 AM  Result Value Ref Range   Lactic Acid, Venous 0.47 (L) 0.5 - 2.0 mmol/L   Dg Chest Port 1 View  02/24/2015  CLINICAL DATA:  Shortness of breath. EXAM: PORTABLE CHEST 1 VIEW COMPARISON:  January 09, 2015. FINDINGS: Stable cardiomegaly. Mild central pulmonary vascular congestion is noted. No pneumothorax is noted. Narrowing of right subacromial space is noted suggesting rotator cuff injury. Increased right basilar opacity is noted concerning for worsening atelectasis, edema or pneumonia with associated minimal pleural effusion. Increased left basilar opacity is noted most consistent with edema or subsegmental atelectasis. IMPRESSION: Mild central pulmonary vascular congestion. Mildly increased left basilar opacity is noted concerning for subsegmental atelectasis or possibly edema. Increased right basilar opacity is noted concerning for worsening atelectasis, edema or pneumonia with associated minimal pleural effusion. Electronically Signed   By: Marijo Conception, M.D.   On: 02/24/2015 08:05    Review of Systems  All other systems reviewed and are negative. The patient currently denies nausea, vomiting, fever, chest pain, shortness of breath, orthopnea, dizziness, PND, cough, congestion, abdominal pain, hematochezia, melena, lower extremity edema, claudication.   Blood pressure 126/51, pulse 81, temperature 97.7 F (36.5 C), temperature source Oral, resp. rate 14, SpO2 96 %. Physical Exam  Nursing note and vitals reviewed.   Well nourished, well developed, in no acute  distress HEENT: Pupils are equal round react to light accommodation extraocular movements are intact.  Neck: Elevated JVDNo cervical lymphadenopathy. Cardiac: Regular rate and rhythm with 3/6 sys murmur Lungs:  Decreased BS bilaterally. Mild diffuse wheezing Abd: soft, nontender, positive bowel sounds all quadrants, no hepatosplenomegaly Ext: Trace lower extremity edema.  2+ radial and dorsalis pedis pulses. Skin: warm and dry Neuro:  Grossly normal  Assessment/Plan Principal Problem:   Acute  on chronic diastolic congestive heart failure (HCC) Active Problems:   HTN (hypertension)   Hyperlipidemia   Aortic stenosis, severe  Acute on chronic diastolic heart failure. There is probably a large component due to her severe aortic stenosis.  She was given IV Lasix in the emergency room. She's already started to diurese. We'll continue the IV Lasix for 2 additional doses and reevaluate in the morning. I do not think she has a lot of fluid to get rid of.  Continue low dose of Coreg. We will  need to change her by mouth diuretic at home to, perhaps, alternating 20 mg and 40 mg every other day.  Continue to monitor serum creatinine and potassium.  I do not see any notes to the effect, but I doubt she is a surgical candidate given her age.  Tarri Fuller, [place] 02/24/2015, 10:33 AM  Patient seen and examined and history reviewed. Agree with above findings and plan. Pleasant 81 yo WF presents with 2 day history of increased SOB. Weight at home fluctuating a couple of lbs. No increased swelling. Recently diagnosed last month with critical AS and mod-severe AI. Still lives independently and does her own washing and cooking. Has always been active. Does use a lot of salt in her cooking. She has generally been in good health. On exam she has minimal rales. Carotid upstrokes delayed and diminished. Harsh gr 2-3/systolic murmur RUSB with absent A2. 1+ edema. BNP is elevated. CXR with pulmonary edema.  Imp:  1.  acute on chronic diastolic CHF secondary to valvular heart disease with severe AS/AI. Recommend IV lasix today. I think she will need a higher maintenance dose of lasix at home- probably 40 mg daily. If her symptoms remain difficult to control she may be a candidate for TAVR.  2. UTI will check urine culture and start antibiotics.   Loni Delbridge [place], [place] 02/24/2015 12:39 PM

## 2015-02-24 NOTE — Progress Notes (Signed)
Family is present with patient and will stay the night. Patient and family declines use of bed alarm at this time. Bed in lowest position and non-skid socks in place.

## 2015-02-25 ENCOUNTER — Telehealth: Payer: Self-pay | Admitting: Cardiology

## 2015-02-25 ENCOUNTER — Other Ambulatory Visit: Payer: Self-pay | Admitting: Physician Assistant

## 2015-02-25 DIAGNOSIS — I5023 Acute on chronic systolic (congestive) heart failure: Secondary | ICD-10-CM

## 2015-02-25 DIAGNOSIS — I5033 Acute on chronic diastolic (congestive) heart failure: Secondary | ICD-10-CM | POA: Diagnosis not present

## 2015-02-25 DIAGNOSIS — I10 Essential (primary) hypertension: Secondary | ICD-10-CM | POA: Diagnosis not present

## 2015-02-25 DIAGNOSIS — I350 Nonrheumatic aortic (valve) stenosis: Secondary | ICD-10-CM | POA: Diagnosis not present

## 2015-02-25 DIAGNOSIS — N3000 Acute cystitis without hematuria: Secondary | ICD-10-CM | POA: Diagnosis not present

## 2015-02-25 LAB — BASIC METABOLIC PANEL
Anion gap: 7 (ref 5–15)
BUN: 19 mg/dL (ref 6–20)
CHLORIDE: 96 mmol/L — AB (ref 101–111)
CO2: 33 mmol/L — AB (ref 22–32)
CREATININE: 1.17 mg/dL — AB (ref 0.44–1.00)
Calcium: 8.5 mg/dL — ABNORMAL LOW (ref 8.9–10.3)
GFR calc non Af Amer: 40 mL/min — ABNORMAL LOW (ref >60)
GFR, EST AFRICAN AMERICAN: 46 mL/min — AB (ref >60)
Glucose, Bld: 118 mg/dL — ABNORMAL HIGH (ref 65–99)
Potassium: 3.3 mmol/L — ABNORMAL LOW (ref 3.5–5.1)
Sodium: 136 mmol/L (ref 135–145)

## 2015-02-25 MED ORDER — POTASSIUM CHLORIDE CRYS ER 20 MEQ PO TBCR: 20.0000 meq | EXTENDED_RELEASE_TABLET | Freq: Every day | ORAL | Status: DC

## 2015-02-25 MED ORDER — FUROSEMIDE 40 MG PO TABS
40.0000 mg | ORAL_TABLET | Freq: Every day | ORAL | Status: DC
  Administered 2015-02-25: 40 mg via ORAL
  Filled 2015-02-25: qty 1

## 2015-02-25 MED ORDER — FUROSEMIDE 40 MG PO TABS: 40.0000 mg | ORAL_TABLET | Freq: Every day | ORAL | Status: DC

## 2015-02-25 MED ORDER — POTASSIUM CHLORIDE CRYS ER 20 MEQ PO TBCR: 40.0000 meq | EXTENDED_RELEASE_TABLET | Freq: Every day | ORAL | Status: DC

## 2015-02-25 MED ORDER — CEPHALEXIN 500 MG PO CAPS: 500.0000 mg | ORAL_CAPSULE | Freq: Two times a day (BID) | ORAL | Status: DC

## 2015-02-25 NOTE — Discharge Summary (Signed)
Discharge Summary   Patient ID: Rebecca Woodard,  MRN: [id number], DOB/AGE: 1922-08-07 81 y.o.  Admit date: 02/24/2015 Discharge date: 02/25/2015  Primary Care Provider: Lupita Woodard Primary Cardiologist: Rebecca Woodard  Discharge Diagnoses Principal Problem:   Acute on chronic diastolic congestive heart failure (HCC) Active Problems:   HTN (hypertension)   Hyperlipidemia   Aortic stenosis, severe   Acute on chronic diastolic heart failure due to valvular disease (HCC)   Acute cystitis   Allergies Allergies  Allergen Reactions  . Amoxicillin Other (See Comments)    Intolerance per son  . Salicylates     All nsaids    Procedures  CXR 02/24/2015 Mild central pulmonary vascular congestion. Mildly increased left basilar opacity is noted concerning for subsegmental atelectasis or possibly edema. Increased right basilar opacity is noted concerning for worsening atelectasis, edema or pneumonia with associated minimal pleural effusion.    Hospital Course  [name] is 81 yo female with PMH of HTN, HLD, severe AS, and chronic mixed systolic and diastolic HF. She was admitted from 9/10-9/12 for acute on chronic heart failure. On arrival, she was noted to have pulmonary edema on chest x-ray. A loud murmur was heard on physical exam. It was later confirmed by echocardiogram that she has severe aortic stenosis with mean aortic area 0.39 cm^2. Her EF was 45-50% with hypokinesis of basal mid anterolateral and inferolateral myocardium. She was admitted for diuresis and discharged on Lasix 20 mg daily. Cardiology service was not consulted during the last admission. She was last seen in cardiology office on 01/14/2015 for post hospital follow-up at which time she was doing well.  She presented to [facility] on 02/24/2015 with shortness of breath. She was found to be in acute on chronic diastolic heart failure. She was given a dose of IV Lasix in the ED. The patient was admitted to  cardiology service for further IV diuresis. Her acute onset of heart failure is likely related to critical AS. She was seen on the following day on 10/27, at which time her weight has went down to 166 pounds which will serve as her dry weight. On exam, she appears to be euvolemic. She has been transitioned to 40 mg daily PO Lasix with potassium supplement. She has been instructed to take an additional dose of Lasix if she gains 3 pounds overnight or has increased shortness of breath. If she fails medical management, we could consider TAVR at a later time. Note, during this admission, she appears to be have a UTI, she has been placed on one-week dose of Keflex. She also has a red right breast lesion which is likely a carbuncle which according to the patient has been present for 2 weeks. She will continue Keflex for now and has been instructed to use warm compresses. She's also been instructed to follow-up with her primary care physician in the next week to reassess. If there is no improvement, she may need drainage or biopsy. Otherwise she is deemed stable for discharge from cardiology perspective. I have also instructed her to avoid salt and keep daily fluid intake to less than 2 L.   Discharge Vitals Blood pressure 121/56, pulse 76, temperature 98.1 F (36.7 C), temperature source Oral, resp. rate 18, height  (1.575 m), weight 166 lb 4.8 oz (75.433 kg), SpO2 95 %.  Filed Weights   02/24/15 1151 02/24/15 1157 02/25/15 0444  Weight: 169 lb (76.658 kg) 169 lb (76.658 kg) 166 lb 4.8 oz (75.433 kg)  Labs  CBC  Recent Labs  02/24/15 0800  WBC 9.6  NEUTROABS 4.4  HGB 10.2*  HCT 33.6*  MCV 93.1  PLT 173   Basic Metabolic Panel  Recent Labs  02/24/15 0800 02/25/15 0257  NA 137 136  K 4.8 3.3*  CL 102 96*  CO2 27 33*  GLUCOSE 124* 118*  BUN 19 19  CREATININE 1.06* 1.17*  CALCIUM 8.8* 8.5*   Liver Function Tests  Recent Labs  02/24/15 0800  AST 20  ALT 15  ALKPHOS 83    BILITOT 0.6  PROT 6.6  ALBUMIN 3.5   Cardiac Enzymes  Recent Labs  02/24/15 0800  TROPONINI 0.03    Disposition  Pt is being discharged home today in good condition.  Follow-up Plans & Appointments      Follow-up Information    Follow up with Rebecca DerrickKILROY,Rebecca K, Woodard On 03/02/2015.   Specialties:  Cardiology, Radiology   Why:  3:00pm. Obtain BMET on the same day to monitor renal function.    Contact information:   [address] [place] [address] [phone]       Schedule an appointment as soon as possible for a visit with Rebecca RaiderSHAW,KIMBERLEE, Woodard.   Specialty:  Family Medicine   Why:  Please followup with your primary care physician in the next 1-2 weeks regarding redness on the breast and UTI.    Contact information:   [address] [place] [address] [phone]       Discharge Medications    Medication List    TAKE these medications        aspirin 81 MG EC tablet  Take 1 tablet (81 mg total) by mouth daily.     carvedilol 3.125 MG tablet  Commonly known as:  COREG  Take 1 tablet (3.125 mg total) by mouth 2 (two) times daily with a meal.     cephALEXin 500 MG capsule  Commonly known as:  KEFLEX  Take 1 capsule (500 mg total) by mouth every 12 (twelve) hours.     dimenhyDRINATE 50 MG tablet  Commonly known as:  DRAMAMINE  Take 50 mg by mouth every 8 (eight) hours as needed for nausea.     furosemide 40 MG tablet  Commonly known as:  LASIX  Take 1 tablet (40 mg total) by mouth daily.     omeprazole 20 MG capsule  Commonly known as:  PRILOSEC  Take 20 mg by mouth 2 (two) times daily before a meal.     ondansetron 4 MG tablet  Commonly known as:  ZOFRAN  Take 1 tablet (4 mg total) by mouth every 8 (eight) hours as needed for nausea or vomiting.     potassium chloride SA 20 MEQ tablet  Commonly known as:  K-DUR,KLOR-CON  Take 2 tablets (40 mEq total) by mouth daily. Take with lasix to replace potassium  Start taking  on:  02/26/2015     simethicone 80 MG chewable tablet  Commonly known as:  MYLICON  Chew 80 mg by mouth every 6 (six) hours as needed for flatulence.     simvastatin 20 MG tablet  Commonly known as:  ZOCOR  Take 20 mg by mouth daily at 6 PM.        Outstanding Labs/Studies  BMET on followup  Duration of Discharge Encounter   Greater than 30 minutes including physician time.  Ramond DialSigned, Jedi Catalfamo Woodard Pager: [phone] 02/25/2015, 2:14 PM

## 2015-02-25 NOTE — Telephone Encounter (Signed)
New problem   [facility] Kilroy 11.1.16 @ [facility]

## 2015-02-25 NOTE — Discharge Instructions (Signed)
Heart Failure Prevention:  1. Avoid salty food  2. Limit daily fluid intake to <2 Liter  3. Monitor daily weight, if weight increase by more than 3 lbs overnight or 5 lbs in a single week or having SOB, you can take additional dose of lasix.

## 2015-02-25 NOTE — Progress Notes (Signed)
TELEMETRY: Reviewed telemetry pt in NSR: Filed Vitals:   02/25/15 0348 02/25/15 0444 02/25/15 0905 02/25/15 1231  BP: 125/52  114/43 121/56  Pulse: 72  70 76  Temp: 97.9 F (36.6 C)   98.1 F (36.7 C)  TempSrc: Oral   Oral  Resp: Height:      Weight:  75.433 kg (166 lb 4.8 oz)    SpO2: 93%  92% 95%    Intake/Output Summary (Last 24 hours) at 02/25/15 1249 Last data filed at 02/25/15 1236  Gross per 24 hour  Intake    720 ml  Output   2150 ml  Net  -1430 ml   Filed Weights   02/24/15 1151 02/24/15 1157 02/25/15 0444  Weight: 76.658 kg (169 lb) 76.658 kg (169 lb) 75.433 kg (166 lb 4.8 oz)    Subjective Feels breathing is much better. Want to go home. Notes an inflamed area on right breast. States is been there 2 weeks and will not go away.  Marland Kitchen aspirin EC  81 mg Oral Daily  . carvedilol  3.125 mg Oral BID WC  . cephALEXin  500 mg Oral Q12H  . heparin  5,000 Units Subcutaneous 3 times per day  . nitrofurantoin (macrocrystal-monohydrate)  100 mg Oral Q12H  . pantoprazole  40 mg Oral Daily  . simvastatin  20 mg Oral q1800  . sodium chloride  3 mL Intravenous Q12H      LABS: Basic Metabolic Panel:  Recent Labs  16/10/96 0800 02/25/15 0257  NA 137 136  K 4.8 3.3*  CL 102 96*  CO2 27 33*  GLUCOSE 124* 118*  BUN 19 19  CREATININE 1.06* 1.17*  CALCIUM 8.8* 8.5*   Liver Function Tests:  Recent Labs  02/24/15 0800  AST 20  ALT 15  ALKPHOS 83  BILITOT 0.6  PROT 6.6  ALBUMIN 3.5   No results for input(s): LIPASE, AMYLASE in the last 72 hours. CBC:  Recent Labs  02/24/15 0800  WBC 9.6  NEUTROABS 4.4  HGB 10.2*  HCT 33.6*  MCV 93.1  PLT 173   Cardiac Enzymes:  Recent Labs  02/24/15 0800  TROPONINI 0.03   BNP: No results for input(s): PROBNP in the last 72 hours. D-Dimer: No results for input(s): DDIMER in the last 72 hours. Hemoglobin A1C: No results for input(s): HGBA1C in the last 72 hours. Fasting Lipid Panel: No results  for input(s): CHOL, HDL, LDLCALC, TRIG, CHOLHDL, LDLDIRECT in the last 72 hours. Thyroid Function Tests: No results for input(s): TSH, T4TOTAL, T3FREE, THYROIDAB in the last 72 hours.  Invalid input(s): FREET3   Radiology/Studies:  Dg Chest Port 1 View  02/24/2015  CLINICAL DATA:  Shortness of breath. EXAM: PORTABLE CHEST 1 VIEW COMPARISON:  January 09, 2015. FINDINGS: Stable cardiomegaly. Mild central pulmonary vascular congestion is noted. No pneumothorax is noted. Narrowing of right subacromial space is noted suggesting rotator cuff injury. Increased right basilar opacity is noted concerning for worsening atelectasis, edema or pneumonia with associated minimal pleural effusion. Increased left basilar opacity is noted most consistent with edema or subsegmental atelectasis. IMPRESSION: Mild central pulmonary vascular congestion. Mildly increased left basilar opacity is noted concerning for subsegmental atelectasis or possibly edema. Increased right basilar opacity is noted concerning for worsening atelectasis, edema or pneumonia with associated minimal pleural effusion. Electronically Signed   By: Lupita Raider, M.D.   On: 02/24/2015 08:05    PHYSICAL EXAM Well nourished, well developed, in no  acute distress  HEENT: Pupils are equal round react to light accommodation extraocular movements are intact.  Neck: Elevated JVD No cervical lymphadenopathy. Cardiac: Regular rate and rhythm with 3/6 sys murmur Lungs: Clear Abd: soft, nontender, positive bowel sounds all quadrants, no hepatosplenomegaly  Ext: No lower extremity edema. 2+ radial and dorsalis pedis pulses. Skin: warm and dry  Neuro: Grossly normal Right breast has a raised indurated and inflamed/red lesion with central black head.   ASSESSMENT AND PLAN: 1. Acute on chronic diastolic CHF. Good diuresis with IV lasix. Weight down to 166 lbs. Lungs clear. Edema resolved. Will DC home on higher dose of lasix 40 mg daily with  potassium supplement. Instructed to continue to monitor weight daily. If she gains more than 3 lbs or has increased SOB she can take an extra lasix Prn.  2. Severe AS/AI. If she continues to do poorly with medical therapy could consider TAVR 3. UTI. Urine culture pending. Will treat with one week of Keflex. 4. Right breast lesion. Appears to be a carbuncle. States it has only been present for 2 weeks. Will monitor response to antibiotics. Warm compresses. Recommend follow up with primary care next week to reassess. If no improvement may need drainage/bx.  Present on Admission:  . Acute on chronic diastolic congestive heart failure (HCC) . HTN (hypertension) . Hyperlipidemia . Acute on chronic diastolic heart failure due to valvular disease (HCC) . Acute cystitis  Signed, Shakeita Vandevander Swaz[place]Jordan, MDFACC 02/25/2015 12:49 PM

## 2015-02-26 LAB — URINE CULTURE: Culture: >=100000

## 2015-02-26 NOTE — Telephone Encounter (Signed)
TOC call to patient.Spoke to daughter.She stated mother is breathing better.She understands discharge instructions and is taking medications as prescribed.Advised to keep post hospital appointment with Corine ShelterLuke Kilroy 03/02/15 at 3:00 pm.

## 2015-03-02 ENCOUNTER — Encounter: Payer: Self-pay | Admitting: Cardiology

## 2015-03-02 ENCOUNTER — Ambulatory Visit (INDEPENDENT_AMBULATORY_CARE_PROVIDER_SITE_OTHER): Payer: Medicare Other | Admitting: Cardiology

## 2015-03-02 ENCOUNTER — Telehealth: Payer: Self-pay | Admitting: Physician Assistant

## 2015-03-02 VITALS — BP 144/66 | HR 84 | Ht 66.0 in | Wt 168.8 lb

## 2015-03-02 DIAGNOSIS — I5033 Acute on chronic diastolic (congestive) heart failure: Secondary | ICD-10-CM

## 2015-03-02 DIAGNOSIS — I350 Nonrheumatic aortic (valve) stenosis: Secondary | ICD-10-CM

## 2015-03-02 DIAGNOSIS — Z79899 Other long term (current) drug therapy: Secondary | ICD-10-CM | POA: Diagnosis not present

## 2015-03-02 DIAGNOSIS — I10 Essential (primary) hypertension: Secondary | ICD-10-CM | POA: Diagnosis not present

## 2015-03-02 DIAGNOSIS — I38 Endocarditis, valve unspecified: Secondary | ICD-10-CM

## 2015-03-02 NOTE — Patient Instructions (Addendum)
Patient's recall for Dr. Eden EmmsNishan will need to be moved to January 2017.  Your physician recommends that you return for lab work in: TODAY

## 2015-03-02 NOTE — Assessment & Plan Note (Signed)
Just re admitted 02/24/15 for CHF exacerbation. Dry wgt around 166. Doing well since discharge.

## 2015-03-02 NOTE — Assessment & Plan Note (Signed)
Consider referral for consideration of TAVR if she fails medical Rx.

## 2015-03-02 NOTE — Telephone Encounter (Signed)
Note, patient's primary cardiologist is Dr. SwazilandJordan, Dr. Eden EmmsNishan has never seen the patient before. I have sent staff message to scheduler to contact the patient to reschedule followup  Signed, Azalee CourseHao Aunisty Reali PA Pager: [phone]

## 2015-03-02 NOTE — Progress Notes (Signed)
03/02/2015 Rebecca Woodard   06/01/1922  [id number]  Primary Physician Lupita Raider, MD Primary Cardiologist: Dr Eden Emms  HPI:  81 y.o. female HTN, HLD, severe AS, and chronic mixed systolic and diastolic CHF. She was admitted to [facility] from 01/09/15-01/11/15 for acute on chronic CHF. She came back to the hospital on 02/24/15 with recurrent CHF. Her diuretics were adjusted and she was discharged 24 hrs later. Her goal wgt is close to 166. She takes Lasix 40 mg daily with instructions for an extra PM dose based on her wgt. She is in the office today for follow up. She is accompanied by her family. She has been doing well since discharge. They seem clear on her Lasix instructions. We did discuss the importance of a low sodium diet. She see Dr Eden Emms in a few months in follow up unless she has recurrent CHF, in which case she should be referred for TAVR evaluation.    Current Outpatient Prescriptions  Medication Sig Dispense Refill  . aspirin EC 81 MG EC tablet Take 1 tablet (81 mg total) by mouth daily. 30 tablet 0  . carvedilol (COREG) 3.125 MG tablet Take 1 tablet (3.125 mg total) by mouth 2 (two) times daily with a meal. 60 tablet 0  . cephALEXin (KEFLEX) 500 MG capsule Take 1 capsule (500 mg total) by mouth every 12 (twelve) hours. 14 capsule 0  . dimenhyDRINATE (DRAMAMINE) 50 MG tablet Take 50 mg by mouth every 8 (eight) hours as needed for nausea.     . furosemide (LASIX) 40 MG tablet Take 1 tablet (40 mg total) by mouth daily. 30 tablet 3  . omeprazole (PRILOSEC) 20 MG capsule Take 20 mg by mouth 2 (two) times daily before a meal.    . potassium chloride SA (K-DUR,KLOR-CON) 20 MEQ tablet Take 2 tablets (40 mEq total) by mouth daily. Take with lasix to replace potassium 60 tablet 3  . simethicone (MYLICON) 80 MG chewable tablet Chew 80 mg by mouth every 6 (six) hours as needed for flatulence.    . simvastatin (ZOCOR) 20 MG tablet Take 20 mg by mouth daily at 6 PM.     No current  facility-administered medications for this visit.    Allergies  Allergen Reactions  . Amoxicillin Other (See Comments)    Intolerance per son  . Salicylates     All nsaids    Social History   Social History  . Marital Status: Married    Spouse Name: [name]  . Number of Children: [name]  . Years of Education: [name]   Occupational History  . Not on file.   Social History Main Topics  . Smoking status: Never Smoker   . Smokeless tobacco: Not on file  . Alcohol Use: No  . Drug Use: No  . Sexual Activity: No   Other Topics Concern  . Not on file   Social History Narrative     Review of Systems: General: negative for chills, fever, night sweats or weight changes.  Cardiovascular: negative for chest pain, dyspnea on exertion, edema, orthopnea, palpitations, paroxysmal nocturnal dyspnea or shortness of breath Dermatological: negative for rash Respiratory: negative for cough or wheezing Urologic: negative for hematuria Abdominal: negative for nausea, vomiting, diarrhea, bright red blood per rectum, melena, or hematemesis Neurologic: negative for visual changes, syncope, or dizziness All other systems reviewed and are otherwise negative except as noted above.    Blood pressure 144/66, pulse 84, height  (1.676 m), weight 168 lb 12.8 oz (  76.567 kg).  General appearance: alert, cooperative and no distress Lungs: decreased Rt base Heart: regular rate and rhythm and 2/6 systolic murmur AOV, decreased S2 Extremities: chronic venous changes, trace edema Neurologic: Grossly normal   ASSESSMENT AND PLAN:   Acute on chronic diastolic heart failure due to valvular disease (HCC) Just re admitted 02/24/15 for CHF exacerbation. Dry wgt around 166. Doing well since discharge.  Aortic stenosis, severe Consider referral for consideration of TAVR if she fails medical Rx.   HTN (hypertension) Controlled, avoid hypotension   PLAN  Check BMP today, f/u Dr Eden EmmsNishan in 3 moiths.    Corine ShelterKILROY,Alaycia Eardley K PA-C 03/02/2015 3:12 PM

## 2015-03-02 NOTE — Assessment & Plan Note (Signed)
Controlled, avoid hypotension

## 2015-03-03 LAB — BASIC METABOLIC PANEL
BUN: 28 mg/dL — ABNORMAL HIGH (ref 7–25)
CO2: 30 mmol/L (ref 20–31)
Calcium: 9 mg/dL (ref 8.6–10.4)
Chloride: 102 mmol/L (ref 98–110)
Creat: 1.31 mg/dL — ABNORMAL HIGH (ref 0.60–0.88)
Glucose, Bld: 105 mg/dL — ABNORMAL HIGH (ref 65–99)
Potassium: 4.9 mmol/L (ref 3.5–5.3)
Sodium: 140 mmol/L (ref 135–146)

## 2015-05-06 ENCOUNTER — Institutional Professional Consult (permissible substitution): Payer: Medicare Other | Admitting: Cardiovascular Disease

## 2015-05-12 ENCOUNTER — Ambulatory Visit: Payer: Medicare Other | Admitting: Cardiovascular Disease

## 2015-05-13 NOTE — Progress Notes (Signed)
Patient ID: Rebecca Woodard, female   DOB: 1922/07/10, 82 y.o.   MRN: [id number]

## 2015-05-14 ENCOUNTER — Encounter: Payer: Medicare Other | Admitting: Cardiovascular Disease

## 2015-05-14 ENCOUNTER — Ambulatory Visit (INDEPENDENT_AMBULATORY_CARE_PROVIDER_SITE_OTHER): Payer: Medicare Other | Admitting: Cardiology

## 2015-05-14 ENCOUNTER — Encounter: Payer: Self-pay | Admitting: Cardiology

## 2015-05-14 VITALS — BP 130/58 | HR 80 | Ht 62.5 in | Wt 164.4 lb

## 2015-05-14 DIAGNOSIS — I509 Heart failure, unspecified: Secondary | ICD-10-CM | POA: Diagnosis not present

## 2015-05-14 LAB — BASIC METABOLIC PANEL
BUN: 24 mg/dL (ref 7–25)
CALCIUM: 9.8 mg/dL (ref 8.6–10.4)
CO2: 30 mmol/L (ref 20–31)
CREATININE: 1.19 mg/dL — AB (ref 0.60–0.88)
Chloride: 99 mmol/L (ref 98–110)
GLUCOSE: 85 mg/dL (ref 65–99)
POTASSIUM: 4.6 mmol/L (ref 3.5–5.3)
SODIUM: 136 mmol/L (ref 135–146)

## 2015-05-14 NOTE — Patient Instructions (Addendum)
Medication Instructions:   CONTINUE SAME MEDICATIONS   If you need a refill on your cardiac medications before your next appointment, please call your pharmacy.  Labwork: BMET    Testing/Procedures:  NONE ORDER TODAY    Follow-Up: IN 3 MONTHS WITH DR COOPER OR DR Clifton JamesMCALHANY FOR AORTIC STENOSIS    Any Other Special Instructions Will Be Listed Below (If Applicable).

## 2015-05-14 NOTE — Progress Notes (Signed)
05/14/2015 Rebecca Woodard   08-05-1922  [id number]  Primary Physician Lupita Raider, MD Primary Cardiologist: Dr. Swaziland (patient requesting to switch to [facility])   Reason for Visit/CC: 3 month F/u for Chronic Combined Systolic + Diastolic CHF and Aortic Stenosis  HPI: 82 y.o. female, followed by Dr. Swaziland, with a h/o HTN, HLD, severe AS, and chronic mixed systolic and diastolic CHF. She was admitted to [facility] from 01/09/15-01/11/15 for acute on chronic CHF.  2D echo 12/2014 showed an EF of 45-50%. There was hypokinesis of the basal-midanterolateral andinferolateral myocardium.  There was severe stenosis of the aortic valve. There was moderate to severe regurgitation. AVA was 0.43 cm2. Mean gradient was 85 mm Hg and Peak gradient was 126 mm hg. She was diuresed and treated medically.   She came back to the hospital on 02/24/15 with recurrent CHF. Her diuretics were adjusted and she was discharged 24 hrs later. Her goal wgt is close to 166 lb. She takes Lasix 40 mg daily with instructions for an extra PM dose based on her wgt.  In regards to her aortic stenosis, plan is for medical therapy but to consider referral to valve clinic for consideration for TAVR if she fails medical therapy.   She was last seen by Rebecca Shelter, PA-C, on 03/02/2015 and was stable from a cardiac standpoint. Her weight was stable at 168 lb. She was instructed to f/u in 2 months.   She presents to clinic today for f/u. She is accompanied by her 2 daughters. She reports that she has done well since her last OV. She denies any CP, dyspnea or syncope/ near syncope. She checks her weight daily and denies any significant weight fluctuations. Her weight today is 164 lb (dry weight is 166 lb). She sleeps with only 1 pillow, w/o orthopnea/ PND. No LEE. She has been adherent to a low sodium diet and compliant with medications. Her BP is well controlled at 130/58. Pluse rate is also stable at 80 bpm.   She is requesting to transfer  her care from [facility] to our [facility].    Current Outpatient Prescriptions  Medication Sig Dispense Refill  . aspirin EC 81 MG EC tablet Take 1 tablet (81 mg total) by mouth daily. 30 tablet 0  . carvedilol (COREG) 3.125 MG tablet Take 1 tablet (3.125 mg total) by mouth 2 (two) times daily with a meal. 60 tablet 0  . dimenhyDRINATE (DRAMAMINE) 50 MG tablet Take 50 mg by mouth every 8 (eight) hours as needed for nausea.     . furosemide (LASIX) 40 MG tablet Take 1 tablet (40 mg total) by mouth daily. 30 tablet 3  . omeprazole (PRILOSEC) 20 MG capsule Take 20 mg by mouth 2 (two) times daily before a meal.    . potassium chloride SA (K-DUR,KLOR-CON) 20 MEQ tablet Take 2 tablets (40 mEq total) by mouth daily. Take with lasix to replace potassium 60 tablet 3  . simethicone (MYLICON) 80 MG chewable tablet Chew 80 mg by mouth every 6 (six) hours as needed for flatulence.    . simvastatin (ZOCOR) 20 MG tablet Take 20 mg by mouth daily at 6 PM.     No current facility-administered medications for this visit.    Allergies  Allergen Reactions  . Amoxicillin Other (See Comments)    Intolerance per son  . Salicylates     All nsaids    Social History   Social History  . Marital Status: Married  Spouse Name: Rebecca Woodard  . Number of Children: Rebecca Woodard  . Years of Education: Rebecca Woodard   Occupational History  . Not on file.   Social History Main Topics  . Smoking status: Never Smoker   . Smokeless tobacco: Not on file  . Alcohol Use: No  . Drug Use: No  . Sexual Activity: No   Other Topics Concern  . Not on file   Social History Narrative     Review of Systems: General: negative for chills, fever, night sweats or weight changes.  Cardiovascular: negative for chest pain, dyspnea on exertion, edema, orthopnea, palpitations, paroxysmal nocturnal dyspnea or shortness of breath Dermatological: negative for rash Respiratory: negative for cough or wheezing Urologic: negative for  hematuria Abdominal: negative for nausea, vomiting, diarrhea, bright red blood per rectum, melena, or hematemesis Neurologic: negative for visual changes, syncope, or dizziness All other systems reviewed and are otherwise negative except as noted above.    Blood pressure 130/58, pulse 80, height 5' 2.5" (1.588 m), weight 164 lb 6.4 oz (74.571 kg).  General appearance: alert, cooperative and no distress Neck: no JVD and + radiation of  AS murmur bilterally Lungs: clear to auscultation bilaterally Heart: regular rate and rhythm and 3/6 SM heard throughout the precordium Extremities: no LEE Pulses: 2+ and symmetric Skin: warm and dry Neurologic: Grossly normal  EKG not performed  ASSESSMENT AND PLAN:   1. Chronic Combined Systolic + Diastolic CHF: EF 11-91%45-50% on last assessment 12/2014. She is euvolemic on exam. Her dry weight goal is 166 lb. Her weight today is 164 lb. She denies dyspnea, orthopnea, PND and LEE. Continue daily lasix + daily weights. She will resume 40 mg daily + supplemental K and knows to take an additional lasix tablet if weight exceeds a 3lb gain in a 24 hr periord. We will check a BMP today to assess renal function and electrolytes. Low sodium diet advised. Continue BB therapy with Coreg.   2. Severe AS: 2D echo 12/2014 showed AVA of  0.43 cm2. Mean gradient was 85 mm Hg and Peak gradient was 126 mm hg. She appears to be doing well with medical mangement, denying angina, dyspnea, syncope/ near syncope. No s/s of acute CHF. Plan is to continue medical management. If worsening CHF or development symtomatic AS, we will refer to Valve Clinic for TAVR consideration.   3. HTN: BP is well controlled on current regimen. No change.   4. HLD: on statin therapy.     PLAN  Patient appears stable from a cardiac standpoint. Continue current plan of care. We will check a BMP today for medication monitoring (chronic lasix). Recommend f/u in 3 months. I would like for her to see a MD at  that time. Dr. SwazilandJordan is listed as her primary cardiologist, however the patient refuses to go back to our [facility]. [facility]. Given her severe AS and possible need for TAVR in the future, would prefer for her to establish care with either Dr. Excell Seltzerooper or Dr. Clifton JamesMcAlhany, if possible.    SIMMONS, BRITTAINY PA-C 05/14/2015 10:20 AM

## 2015-06-16 ENCOUNTER — Other Ambulatory Visit: Payer: Self-pay | Admitting: Physician Assistant

## 2015-06-16 NOTE — Telephone Encounter (Signed)
Please review for refill. Thanks!  

## 2015-06-16 NOTE — Telephone Encounter (Signed)
Please review for refill, Thank you. 

## 2015-07-03 ENCOUNTER — Emergency Department (HOSPITAL_COMMUNITY)
Admission: EM | Admit: 2015-07-03 | Discharge: 2015-07-04 | Disposition: A | Discharge status: Home / Self Care | Payer: Medicare Other | Source: Home / Self Care | Attending: Emergency Medicine | Admitting: Emergency Medicine

## 2015-07-03 ENCOUNTER — Encounter (HOSPITAL_COMMUNITY): Payer: Self-pay | Admitting: Emergency Medicine

## 2015-07-03 DIAGNOSIS — K219 Gastro-esophageal reflux disease without esophagitis: Secondary | ICD-10-CM

## 2015-07-03 DIAGNOSIS — Z7982 Long term (current) use of aspirin: Secondary | ICD-10-CM | POA: Insufficient documentation

## 2015-07-03 DIAGNOSIS — Z880 Allergy status to penicillin: Secondary | ICD-10-CM | POA: Insufficient documentation

## 2015-07-03 DIAGNOSIS — R011 Cardiac murmur, unspecified: Secondary | ICD-10-CM | POA: Insufficient documentation

## 2015-07-03 DIAGNOSIS — Z79899 Other long term (current) drug therapy: Secondary | ICD-10-CM

## 2015-07-03 DIAGNOSIS — E785 Hyperlipidemia, unspecified: Secondary | ICD-10-CM

## 2015-07-03 DIAGNOSIS — Z862 Personal history of diseases of the blood and blood-forming organs and certain disorders involving the immune mechanism: Secondary | ICD-10-CM | POA: Insufficient documentation

## 2015-07-03 DIAGNOSIS — I350 Nonrheumatic aortic (valve) stenosis: Secondary | ICD-10-CM

## 2015-07-03 DIAGNOSIS — I5033 Acute on chronic diastolic (congestive) heart failure: Secondary | ICD-10-CM | POA: Insufficient documentation

## 2015-07-03 DIAGNOSIS — I10 Essential (primary) hypertension: Secondary | ICD-10-CM

## 2015-07-03 DIAGNOSIS — Z8669 Personal history of other diseases of the nervous system and sense organs: Secondary | ICD-10-CM | POA: Insufficient documentation

## 2015-07-03 DIAGNOSIS — R55 Syncope and collapse: Secondary | ICD-10-CM

## 2015-07-03 DIAGNOSIS — K851 Biliary acute pancreatitis without necrosis or infection: Secondary | ICD-10-CM | POA: Diagnosis not present

## 2015-07-03 LAB — BASIC METABOLIC PANEL
Anion gap: 11 (ref 5–15)
BUN: 27 mg/dL — ABNORMAL HIGH (ref 6–20)
CALCIUM: 8.8 mg/dL — AB (ref 8.9–10.3)
CO2: 26 mmol/L (ref 22–32)
CREATININE: 1.46 mg/dL — AB (ref 0.44–1.00)
Chloride: 99 mmol/L — ABNORMAL LOW (ref 101–111)
GFR, EST AFRICAN AMERICAN: 35 mL/min — AB (ref >60)
GFR, EST NON AFRICAN AMERICAN: 30 mL/min — AB (ref >60)
Glucose, Bld: 159 mg/dL — ABNORMAL HIGH (ref 65–99)
Potassium: 5.1 mmol/L (ref 3.5–5.1)
Sodium: 136 mmol/L (ref 135–145)

## 2015-07-03 LAB — CBC
HCT: 33.3 % — ABNORMAL LOW (ref 36.0–46.0)
Hemoglobin: 10.2 g/dL — ABNORMAL LOW (ref 12.0–15.0)
MCH: 27.3 pg (ref 26.0–34.0)
MCHC: 30.6 g/dL (ref 30.0–36.0)
MCV: 89.3 fL (ref 78.0–100.0)
Platelets: 139 10*3/uL — ABNORMAL LOW (ref 150–400)
RBC: 3.73 MIL/uL — AB (ref 3.87–5.11)
RDW: 17.2 % — ABNORMAL HIGH (ref 11.5–15.5)
WBC: 9.2 10*3/uL (ref 4.0–10.5)

## 2015-07-03 LAB — URINALYSIS, ROUTINE W REFLEX MICROSCOPIC
GLUCOSE, UA: NEGATIVE mg/dL
Ketones, ur: NEGATIVE mg/dL
LEUKOCYTES UA: NEGATIVE
NITRITE: NEGATIVE
PH: 5 (ref 5.0–8.0)
Protein, ur: NEGATIVE mg/dL
SPECIFIC GRAVITY, URINE: 1.017 (ref 1.005–1.030)

## 2015-07-03 LAB — I-STAT TROPONIN, ED: Troponin i, poc: 0.06 ng/mL (ref 0.00–0.08)

## 2015-07-03 LAB — URINE MICROSCOPIC-ADD ON

## 2015-07-03 LAB — CBG MONITORING, ED: Glucose-Capillary: 152 mg/dL — ABNORMAL HIGH (ref 65–99)

## 2015-07-03 LAB — BRAIN NATRIURETIC PEPTIDE: B NATRIURETIC PEPTIDE 5: 1600.5 pg/mL — AB (ref 0.0–100.0)

## 2015-07-03 NOTE — ED Notes (Signed)
98.1 Pt coming from home. + LOC Diaphoretic, hot and sweaty, called out for possible stroke. EMS ruled out stroke.   CHF  N/V/Syncope   PVCs and PACs  18G LAC

## 2015-07-03 NOTE — ED Provider Notes (Signed)
CSN: [id number]     Arrival date & time 07/03/15  2117 History   First MD Initiated Contact with Patient 07/03/15 2223     Chief Complaint  Patient presents with  . Nausea  . Loss of Consciousness   Patient is a 82 y.o. female presenting with syncope. The history is provided by the patient and a relative. No language interpreter was used.  Loss of Consciousness Episode history:  Single Most recent episode:  Today Timing:  Rare Progression:  Resolved Chronicity:  New Context: sitting down   Witnessed: yes   Relieved by:  None tried Worsened by:  Nothing tried Ineffective treatments:  None tried Associated symptoms: no chest pain, no confusion, no dizziness, no fever, no headaches, no nausea, no palpitations, no recent fall, no shortness of breath, no vomiting and no weakness     Past Medical History  Diagnosis Date  . Acid reflux   . Gallstones   . Heart murmur   . Inner ear disease   . HTN (hypertension) 01/09/2015  . GERD (gastroesophageal reflux disease) 01/09/2015  . Hyperlipidemia 01/09/2015  . History of gallstones 01/09/2015  . Anemia 01/09/2015   Past Surgical History  Procedure Laterality Date  . Tonsilectomy, adenoidectomy, bilateral myringotomy and tubes Bilateral   . Appendectomy Bilateral    Family History  Problem Relation Age of Onset  . Heart failure Mother   . Dementia Father   . Heart attack Father    Social History  Substance Use Topics  . Smoking status: Never Smoker   . Smokeless tobacco: None  . Alcohol Use: No   OB History    No data available     Review of Systems  Constitutional: Negative for fever, chills, activity change and appetite change.  HENT: Negative for congestion, dental problem, ear pain, facial swelling, hearing loss, rhinorrhea, sneezing, sore throat, trouble swallowing and voice change.   Eyes: Negative for photophobia, pain, redness and visual disturbance.  Respiratory: Negative for apnea, cough, chest tightness, shortness  of breath, wheezing and stridor.   Cardiovascular: Positive for syncope. Negative for chest pain, palpitations and leg swelling.  Gastrointestinal: Negative for nausea, vomiting, abdominal pain, diarrhea, constipation, blood in stool and abdominal distention.  Endocrine: Negative for polydipsia and polyuria.  Genitourinary: Negative for frequency, hematuria, flank pain, decreased urine volume and difficulty urinating.  Musculoskeletal: Negative for back pain, joint swelling, gait problem, neck pain and neck stiffness.  Skin: Negative for rash and wound.  Allergic/Immunologic: Negative for immunocompromised state.  Neurological: Negative for dizziness, syncope, facial asymmetry, speech difficulty, weakness, light-headedness, numbness and headaches.  Hematological: Negative for adenopathy.  Psychiatric/Behavioral: Negative for suicidal ideas, behavioral problems, confusion, sleep disturbance and agitation. The patient is not nervous/anxious.   All other systems reviewed and are negative.     Allergies  Amoxicillin and Salicylates  Home Medications   Prior to Admission medications   Medication Sig Start Date End Date Taking? Authorizing Provider  aspirin EC 81 MG EC tablet Take 1 tablet (81 mg total) by mouth daily. 01/11/15  Yes Leroy Sea, MD  carvedilol (COREG) 3.125 MG tablet Take 1 tablet (3.125 mg total) by mouth 2 (two) times daily with a meal. 01/11/15  Yes Leroy Sea, MD  furosemide (LASIX) 40 MG tablet TAKE 1 TABLET (40 MG TOTAL) BY MOUTH DAILY. Patient taking differently: TAKE 1 TABLET (40 MG TOTAL) BY MOUTH DAILY, MAY TAKE ANOTHER TABLET DURING THE DAY IF NEEDED FOR SWELLING/ SHORTNESS OF BREATH 06/16/15  Yes Azalee Course, PA  KLOR-CON M20 20 MEQ tablet TAKE 2 TABLETS (40 MEQ TOTAL) BY MOUTH DAILY. TAKE WITH LASIX TO REPLACE POTASSIUM Patient taking differently: TAKE 1 TABLET BY MOUTH TWICE DAILY. TAKE WITH LASIX TO REPLACE POTASSIUM 06/16/15  Yes Azalee Course, PA  omeprazole  (PRILOSEC) 20 MG capsule Take 20 mg by mouth 2 (two) times daily. 06/16/15  Yes Historical Provider, MD  OVER THE COUNTER MEDICATION Over the counter motion sickness pill - take 1 tablet by mouth every morning and may also take 1 or 2 tablets during the day as needed for nausea   Yes Historical Provider, MD  simethicone (MYLICON) 80 MG chewable tablet Chew 80 mg by mouth every 6 (six) hours as needed for flatulence.   Yes Historical Provider, MD  simvastatin (ZOCOR) 20 MG tablet Take 20 mg by mouth daily at 6 PM.   Yes Historical Provider, MD   BP 94/47 mmHg  Pulse 76  Temp(Src) 97.8 F (36.6 C) (Oral)  Resp 17  Ht  (1.575 m)  SpO2 93% Physical Exam  Constitutional: She is oriented to person, place, and time. She appears well-developed and well-nourished. No distress.  HENT:  Head: Normocephalic and atraumatic.  Right Ear: External ear normal.  Left Ear: External ear normal.  Eyes: Pupils are equal, round, and reactive to light. Right eye exhibits no discharge. Left eye exhibits no discharge.  Neck: Normal range of motion. No JVD present. No tracheal deviation present.  Cardiovascular: Normal rate, regular rhythm and normal heart sounds.  Exam reveals no friction rub.   No murmur heard. Pulmonary/Chest: Effort normal and breath sounds normal. No stridor. No respiratory distress. She has no wheezes.  Abdominal: Soft. Bowel sounds are normal. She exhibits no distension. There is no rebound and no guarding.  Musculoskeletal: Normal range of motion. She exhibits no edema or tenderness.  Lymphadenopathy:    She has no cervical adenopathy.  Neurological: She is alert and oriented to person, place, and time. No cranial nerve deficit. Coordination normal.  Skin: Skin is warm and dry. No rash noted. No pallor.  Psychiatric: She has a normal mood and affect. Her behavior is normal. Judgment and thought content normal.  Nursing note and vitals reviewed.   ED Course  Procedures (including  critical care time) Labs Review Labs Reviewed  BASIC METABOLIC PANEL - Abnormal; Notable for the following:    Chloride 99 (*)    Glucose, Bld 159 (*)    BUN 27 (*)    Creatinine, Ser 1.46 (*)    Calcium 8.8 (*)    GFR calc non Af Amer 30 (*)    GFR calc Af Amer 35 (*)    All other components within normal limits  CBC - Abnormal; Notable for the following:    RBC 3.73 (*)    Hemoglobin 10.2 (*)    HCT 33.3 (*)    RDW 17.2 (*)    Platelets 139 (*)    All other components within normal limits  URINALYSIS, ROUTINE W REFLEX MICROSCOPIC (NOT AT [facility]) - Abnormal; Notable for the following:    Color, Urine AMBER (*)    Hgb urine dipstick MODERATE (*)    Bilirubin Urine SMALL (*)    All other components within normal limits  BRAIN NATRIURETIC PEPTIDE - Abnormal; Notable for the following:    B Natriuretic Peptide 1600.5 (*)    All other components within normal limits  URINE MICROSCOPIC-ADD ON - Abnormal; Notable for the following:  Squamous Epithelial / LPF 6-30 (*)    Bacteria, UA MANY (*)    Casts HYALINE CASTS (*)    All other components within normal limits  CBG MONITORING, ED - Abnormal; Notable for the following:    Glucose-Capillary 152 (*)    All other components within normal limits  I-STAT TROPOININ, ED    Imaging Review No results found. I have personally reviewed and evaluated these images and lab results as part of my medical decision-making.   EKG Interpretation   Date/Time:  Saturday July 03 2015 21:28:26 EST Ventricular Rate:  79 PR Interval:    QRS Duration: 112 QT Interval:  418 QTC Calculation: 479 R Axis:   -14 Text Interpretation:  Atrial flutter with predominant 3:1 AV block  Incomplete right bundle branch block LVH with IVCD and secondary repol  abnrm Artifact Confirmed by Deretha EmoryZACKOWSKI  MD, SCOTT ([id number] on 07/03/2015  10:41:15 PM      MDM   Final diagnoses:  Aortic stenosis, severe  Syncope, unspecified syncope type    Patient has a  history of severe acute on chronic diastolic heart failure due to aortic stenosis who presents via EMS for evaluation of syncopal episode today. Patient was sitting at her table which he felt warm, diaphoretic and passed out for less than 1 minute. She did not fall, hit her head or suffer any trauma. Patient feels completely back to normal at this time.  EKG with normal sinus rhythm, rate 79. No ischemic changes noted on EKG.  UA negative for nitrates, many squamous epithelial cells and bacteria. Favor contaminant, will send urine culture. Troponin negative, blood glucose normal. BMP near baseline. CBC unremarkable. BNP elevated but less than prior.  I suspect that single episode likely due to severe aortic stenosis with chronic heart failure. Reviewed cardiology notes which indicate that patient has declined TAVR in the past. I discussed with family ability to admit patient for syncopal event and cardiac monitoring in an inpatient setting. I discussed with multiple children as well as grandchildren. The patient and family wished to be discharged home and patient does not wish to have her underlying aortic stenosis fixed.  Vital signs stable and emergency department. She wishes to be discharged, is alert and oriented. She is able to follow-up with her family physician as well as cardiologist for reevaluation as an outpatient.  She was invited back to the emergency department should she have worsening of her symptoms or change her mind.  Discussed case my attending, Dr. Deretha EmoryZackowski.      Dan HumphreysMichael Whitley Strycharz, MD 07/04/15 [phone]

## 2015-07-03 NOTE — ED Notes (Signed)
CHECKED CBG 152, RN JAZMINE INFORMED

## 2015-07-04 NOTE — ED Provider Notes (Signed)
I saw and evaluated the patient, reviewed the resident's note and I agree with the findings and plan.   EKG Interpretation   Date/Time:  Saturday July 03 2015 21:28:26 EST Ventricular Rate:  79 PR Interval:    QRS Duration: 112 QT Interval:  418 QTC Calculation: 479 R Axis:   -14 Text Interpretation:  Atrial flutter with predominant 3:1 AV block  Incomplete right bundle branch block LVH with IVCD and secondary repol  abnrm Artifact Confirmed by Sanae Willetts  MD, Biana Haggar [phone] on 07/03/2015  10:41:15 PM      Results for orders placed or performed during the hospital encounter of 07/03/15  Basic metabolic panel  Result Value Ref Range   Sodium 136 135 - 145 mmol/L   Potassium 5.1 3.5 - 5.1 mmol/L   Chloride 99 (L) 101 - 111 mmol/L   CO2 26 22 - 32 mmol/L   Glucose, Bld 159 (H) 65 - 99 mg/dL   BUN 27 (H) 6 - 20 mg/dL   Creatinine, Ser 6.04 (H) 0.44 - 1.00 mg/dL   Calcium 8.8 (L) 8.9 - 10.3 mg/dL   GFR calc non Af Amer 30 (L) >60 mL/min   GFR calc Af Amer 35 (L) >60 mL/min   Anion gap 11 5 - 15  CBC  Result Value Ref Range   WBC 9.2 4.0 - 10.5 K/uL   RBC 3.73 (L) 3.87 - 5.11 MIL/uL   Hemoglobin 10.2 (L) 12.0 - 15.0 g/dL   HCT 54.0 (L) 98.1 - 19.1 %   MCV 89.3 78.0 - 100.0 fL   MCH 27.3 26.0 - 34.0 pg   MCHC 30.6 30.0 - 36.0 g/dL   RDW 47.8 (H) 29.5 - 62.1 %   Platelets 139 (L) 150 - 400 K/uL  Urinalysis, Routine w reflex microscopic (not at [facility])  Result Value Ref Range   Color, Urine AMBER (A) YELLOW   APPearance CLEAR CLEAR   Specific Gravity, Urine 1.017 1.005 - 1.030   pH 5.0 5.0 - 8.0   Glucose, UA NEGATIVE NEGATIVE mg/dL   Hgb urine dipstick MODERATE (A) NEGATIVE   Bilirubin Urine SMALL (A) NEGATIVE   Ketones, ur NEGATIVE NEGATIVE mg/dL   Protein, ur NEGATIVE NEGATIVE mg/dL   Nitrite NEGATIVE NEGATIVE   Leukocytes, UA NEGATIVE NEGATIVE  Brain natriuretic peptide  Result Value Ref Range   B Natriuretic Peptide 1600.5 (H) 0.0 - 100.0 pg/mL  Urine  microscopic-add on  Result Value Ref Range   Squamous Epithelial / LPF 6-30 (A) NONE SEEN   WBC, UA 0-5 0 - 5 WBC/hpf   RBC / HPF 6-30 0 - 5 RBC/hpf   Bacteria, UA MANY (A) NONE SEEN   Casts HYALINE CASTS (A) NEGATIVE  CBG monitoring, ED  Result Value Ref Range   Glucose-Capillary 152 (H) 65 - 99 mg/dL   Comment 1 Notify RN   I-Stat Troponin, ED (not at [facility])  Result Value Ref Range   Troponin i, poc 0.06 0.00 - 0.08 ng/mL   Comment 3           No results found.  Patient seen by me. Patient brought in from home. Had a syncopal episode. Patient is now asymptomatic. Workup without any significant findings. Patient is known to have significant aortic stenosis. Probably not an operative candidate. Patient could be a candidate to go home with family wishes based on her age. Otherwise clearly can be admitted for cardiac monitoring due to the syncope. Family seems to prefer to take patient  home but they're having a discussion.  EKG read by computer as a 3-1 atrial flutter. Looks more like sinus rhythm. Labs without any significant abnormalities. If family desires discharge home it's okay close follow-up with cardiology is necessary.  Vanetta MuldersScott Emorie Mcfate, MD 07/04/15 [phone]

## 2015-07-04 NOTE — Discharge Instructions (Signed)
Aortic Stenosis Aortic stenosis is a narrowing of the aortic valve. The aortic valve is a gate-like structure that is located between the lower left chamber of the heart (left ventricle) and the blood vessel that leads away from the heart (aorta). When the aortic valve is narrowed, it does not open all the way. This makes it hard for the heart to pump blood into the aorta and causes the heart to work harder. The extra work can weaken the heart over time and lead to heart failure. CAUSES  Causes of aortic stenosis include:  Calcium deposits on the aortic valve that have made the valve stiff. This condition generally affects those over the age of 65. It is the most common cause of aortic stenosis.  A birth defect.  Rheumatic fever. This is a problem that may occur after a strep throat infection that was not treated adequately. Rheumatic fever can cause permanent damage to heart valves. SIGNS AND SYMPTOMS  People with aortic stenosis usually have no symptoms until the condition becomes severe. It may take 10-20 years for mild or moderate aortic stenosis to become severe. Symptoms may include:   Shortness of breath, especially with physical activity.   Feeling weak and tired (fatigued) or getting tired easily.  Chest discomfort (angina). This may occur with minimal activity if the aortic stenosis is severe.  An irregular or faster-than-normal heartbeat.  Dizziness or fainting that happens with exertion or after taking certain heart medicines (such as nitroglycerin). DIAGNOSIS  Aortic stenosis is usually diagnosed with a physical exam and with a type of imaging test called echocardiography. During echocardiography, sound waves are used to evaluate how blood flows through the heart. If your health care provider suspects aortic stenosis but the test does not clearly show it, a procedure called cardiac catheterization may be done to diagnose the condition. Tests may also be done to evaluate heart  function. They may include:  Electrocardiography. During this test, the electrical impulses of the heart are recorded while you are lying down and sticky patches are placed on your chest, arms, and legs.  Stress tests. There is more than one type of stress test. If a stress test is needed, ask your health care provider about which type is best for you.  Blood tests. TREATMENT  Treatment depends on how severe the aortic stenosis is, your symptoms, and the problems it is causing.   Observation. If the aortic stenosis is mild, no treatment may be needed. However, you will need to have the condition checked regularly to make sure it is not getting worse or causing serious problems.  Surgery. Surgery to repair or replace the aortic valve is the most common treatment for aortic stenosis. Several types of surgeries are available. The most common are open-heart surgery and transcutaneous aortic valve replacement (TAVR). TAVR does not require that the chest be opened. It is usually performed on elderly patients and those who are not able to have open-heart surgery.  Medicines. Medicines may be given to keep symptoms from getting worse. Medicines cannot reverse aortic stenosis. HOME CARE INSTRUCTIONS   You may need to avoid certain types of physical activity. If your aortic stenosis is mild, you may need to avoid only strenuous activity. The more severe your aortic stenosis, the more activities you will need to avoid. Talk with your health care provider about the types of activity you should avoid.  Take medicines only as directed by your health care provider.  If you are a woman with   aortic valve stenosis and want to get pregnant, talk to your health care provider before you become pregnant.  If you are a woman with aortic valve stenosis and are pregnant, keep all follow-up visits with all recommended health care providers.  Keep all follow-up visits for tests, exams, and treatments as directed by  your health care provider. SEEK IMMEDIATE MEDICAL CARE IF:  You develop chest pain or tightness.   You develop shortness of breath or difficulty breathing.   You develop light-headedness or faint.   It feels like your heartbeat is irregular or faster than normal.  You have a fever.   This information is not intended to replace advice given to you by your health care provider. Make sure you discuss any questions you have with your health care provider.   Document Released: 01/14/2003 Document Revised: 01/06/2015 Document Reviewed: 04/11/2012 Elsevier Interactive Patient Education 2016 [employer]  

## 2015-07-05 ENCOUNTER — Inpatient Hospital Stay (HOSPITAL_COMMUNITY): Payer: Medicare Other

## 2015-07-05 ENCOUNTER — Inpatient Hospital Stay (HOSPITAL_COMMUNITY)
Admission: EM | Admit: 2015-07-05 | Discharge: 2015-07-11 | DRG: 438 | Disposition: A | Discharge status: Home / Self Care | Payer: Medicare Other | Attending: Internal Medicine | Admitting: Internal Medicine

## 2015-07-05 ENCOUNTER — Encounter (HOSPITAL_COMMUNITY): Payer: Self-pay | Admitting: Emergency Medicine

## 2015-07-05 DIAGNOSIS — K851 Biliary acute pancreatitis without necrosis or infection: Principal | ICD-10-CM | POA: Diagnosis present

## 2015-07-05 DIAGNOSIS — I350 Nonrheumatic aortic (valve) stenosis: Secondary | ICD-10-CM | POA: Diagnosis not present

## 2015-07-05 DIAGNOSIS — E785 Hyperlipidemia, unspecified: Secondary | ICD-10-CM | POA: Diagnosis not present

## 2015-07-05 DIAGNOSIS — I38 Endocarditis, valve unspecified: Secondary | ICD-10-CM | POA: Diagnosis not present

## 2015-07-05 DIAGNOSIS — Z79899 Other long term (current) drug therapy: Secondary | ICD-10-CM | POA: Diagnosis not present

## 2015-07-05 DIAGNOSIS — K8050 Calculus of bile duct without cholangitis or cholecystitis without obstruction: Secondary | ICD-10-CM | POA: Diagnosis present

## 2015-07-05 DIAGNOSIS — R7989 Other specified abnormal findings of blood chemistry: Secondary | ICD-10-CM | POA: Diagnosis present

## 2015-07-05 DIAGNOSIS — I4510 Unspecified right bundle-branch block: Secondary | ICD-10-CM | POA: Diagnosis present

## 2015-07-05 DIAGNOSIS — I110 Hypertensive heart disease with heart failure: Secondary | ICD-10-CM | POA: Diagnosis present

## 2015-07-05 DIAGNOSIS — K8064 Calculus of gallbladder and bile duct with chronic cholecystitis without obstruction: Secondary | ICD-10-CM | POA: Diagnosis present

## 2015-07-05 DIAGNOSIS — I5043 Acute on chronic combined systolic (congestive) and diastolic (congestive) heart failure: Secondary | ICD-10-CM | POA: Diagnosis present

## 2015-07-05 DIAGNOSIS — N179 Acute kidney failure, unspecified: Secondary | ICD-10-CM | POA: Diagnosis present

## 2015-07-05 DIAGNOSIS — D72829 Elevated white blood cell count, unspecified: Secondary | ICD-10-CM

## 2015-07-05 DIAGNOSIS — Z8249 Family history of ischemic heart disease and other diseases of the circulatory system: Secondary | ICD-10-CM | POA: Diagnosis not present

## 2015-07-05 DIAGNOSIS — I5033 Acute on chronic diastolic (congestive) heart failure: Secondary | ICD-10-CM

## 2015-07-05 DIAGNOSIS — I130 Hypertensive heart and chronic kidney disease with heart failure and stage 1 through stage 4 chronic kidney disease, or unspecified chronic kidney disease: Secondary | ICD-10-CM | POA: Diagnosis present

## 2015-07-05 DIAGNOSIS — N183 Chronic kidney disease, stage 3 (moderate): Secondary | ICD-10-CM | POA: Diagnosis present

## 2015-07-05 DIAGNOSIS — Q445 Other congenital malformations of bile ducts: Secondary | ICD-10-CM

## 2015-07-05 DIAGNOSIS — N289 Disorder of kidney and ureter, unspecified: Secondary | ICD-10-CM

## 2015-07-05 DIAGNOSIS — K219 Gastro-esophageal reflux disease without esophagitis: Secondary | ICD-10-CM | POA: Diagnosis present

## 2015-07-05 DIAGNOSIS — R748 Abnormal levels of other serum enzymes: Secondary | ICD-10-CM

## 2015-07-05 DIAGNOSIS — I440 Atrioventricular block, first degree: Secondary | ICD-10-CM | POA: Diagnosis present

## 2015-07-05 DIAGNOSIS — R0600 Dyspnea, unspecified: Secondary | ICD-10-CM

## 2015-07-05 DIAGNOSIS — I5042 Chronic combined systolic (congestive) and diastolic (congestive) heart failure: Secondary | ICD-10-CM | POA: Diagnosis present

## 2015-07-05 DIAGNOSIS — R17 Unspecified jaundice: Secondary | ICD-10-CM

## 2015-07-05 DIAGNOSIS — Z7982 Long term (current) use of aspirin: Secondary | ICD-10-CM

## 2015-07-05 DIAGNOSIS — K859 Acute pancreatitis, unspecified: Secondary | ICD-10-CM

## 2015-07-05 DIAGNOSIS — R55 Syncope and collapse: Secondary | ICD-10-CM | POA: Diagnosis present

## 2015-07-05 DIAGNOSIS — I10 Essential (primary) hypertension: Secondary | ICD-10-CM | POA: Diagnosis not present

## 2015-07-05 LAB — I-STAT TROPONIN, ED: TROPONIN I, POC: 0.04 ng/mL (ref 0.00–0.08)

## 2015-07-05 LAB — COMPREHENSIVE METABOLIC PANEL
ALT: 453 U/L — ABNORMAL HIGH (ref 14–54)
ANION GAP: 13 (ref 5–15)
AST: 512 U/L — ABNORMAL HIGH (ref 15–41)
Albumin: 4 g/dL (ref 3.5–5.0)
Alkaline Phosphatase: 217 U/L — ABNORMAL HIGH (ref 38–126)
BUN: 36 mg/dL — ABNORMAL HIGH (ref 6–20)
CALCIUM: 9.2 mg/dL (ref 8.9–10.3)
CHLORIDE: 98 mmol/L — AB (ref 101–111)
CO2: 25 mmol/L (ref 22–32)
Creatinine, Ser: 1.76 mg/dL — ABNORMAL HIGH (ref 0.44–1.00)
GFR calc non Af Amer: 24 mL/min — ABNORMAL LOW (ref >60)
GFR, EST AFRICAN AMERICAN: 28 mL/min — AB (ref >60)
Glucose, Bld: 113 mg/dL — ABNORMAL HIGH (ref 65–99)
POTASSIUM: 4.5 mmol/L (ref 3.5–5.1)
SODIUM: 136 mmol/L (ref 135–145)
Total Bilirubin: 4.5 mg/dL — ABNORMAL HIGH (ref 0.3–1.2)
Total Protein: 7.5 g/dL (ref 6.5–8.1)

## 2015-07-05 LAB — URINALYSIS, ROUTINE W REFLEX MICROSCOPIC
Glucose, UA: NEGATIVE mg/dL
Ketones, ur: NEGATIVE mg/dL
Nitrite: NEGATIVE
Protein, ur: 100 mg/dL — AB
SPECIFIC GRAVITY, URINE: 1.024 (ref 1.005–1.030)
pH: 5 (ref 5.0–8.0)

## 2015-07-05 LAB — BILIRUBIN, FRACTIONATED(TOT/DIR/INDIR)
BILIRUBIN DIRECT: 2.6 mg/dL — AB (ref 0.1–0.5)
BILIRUBIN INDIRECT: 1.8 mg/dL — AB (ref 0.3–0.9)
Total Bilirubin: 4.4 mg/dL — ABNORMAL HIGH (ref 0.3–1.2)

## 2015-07-05 LAB — CBC
HCT: 35.5 % — ABNORMAL LOW (ref 36.0–46.0)
HEMOGLOBIN: 10.7 g/dL — AB (ref 12.0–15.0)
MCH: 27.1 pg (ref 26.0–34.0)
MCHC: 30.1 g/dL (ref 30.0–36.0)
MCV: 89.9 fL (ref 78.0–100.0)
Platelets: 154 10*3/uL (ref 150–400)
RBC: 3.95 MIL/uL (ref 3.87–5.11)
RDW: 17.1 % — ABNORMAL HIGH (ref 11.5–15.5)
WBC: 13.5 10*3/uL — ABNORMAL HIGH (ref 4.0–10.5)

## 2015-07-05 LAB — URINE MICROSCOPIC-ADD ON

## 2015-07-05 LAB — LIPASE, BLOOD: LIPASE: 2693 U/L — AB (ref 11–51)

## 2015-07-05 LAB — MRSA PCR SCREENING: MRSA BY PCR: POSITIVE — AB

## 2015-07-05 MED ORDER — MORPHINE SULFATE (PF) 4 MG/ML IV SOLN
4.0000 mg | Freq: Once | INTRAVENOUS | Status: AC
  Administered 2015-07-05: 4 mg via INTRAVENOUS
  Filled 2015-07-05: qty 1

## 2015-07-05 MED ORDER — ONDANSETRON HCL 4 MG/2ML IJ SOLN: 4.0000 mg | Freq: Three times a day (TID) | INTRAMUSCULAR | Status: AC | PRN

## 2015-07-05 MED ORDER — HYDROMORPHONE HCL 1 MG/ML IJ SOLN
0.5000 mg | INTRAMUSCULAR | Status: DC | PRN
  Administered 2015-07-07: 1 mg via INTRAVENOUS
  Administered 2015-07-07: 0.5 mg via INTRAVENOUS
  Administered 2015-07-08: 1 mg via INTRAVENOUS
  Filled 2015-07-05 (×4): qty 1

## 2015-07-05 MED ORDER — PANTOPRAZOLE SODIUM 40 MG PO TBEC
40.0000 mg | DELAYED_RELEASE_TABLET | Freq: Every day | ORAL | Status: DC
  Administered 2015-07-05: 40 mg via ORAL
  Filled 2015-07-05 (×2): qty 1

## 2015-07-05 MED ORDER — ONDANSETRON HCL 4 MG/2ML IJ SOLN
4.0000 mg | Freq: Once | INTRAMUSCULAR | Status: AC
  Administered 2015-07-05: 4 mg via INTRAVENOUS
  Filled 2015-07-05: qty 2

## 2015-07-05 MED ORDER — BISACODYL 10 MG RE SUPP: 10.0000 mg | Freq: Every day | RECTAL | Status: DC | PRN

## 2015-07-05 MED ORDER — ACETAMINOPHEN 325 MG PO TABS: 650.0000 mg | ORAL_TABLET | Freq: Four times a day (QID) | ORAL | Status: DC | PRN

## 2015-07-05 MED ORDER — HYDROMORPHONE HCL 1 MG/ML IJ SOLN: 0.5000 mg | INTRAMUSCULAR | Status: DC | PRN

## 2015-07-05 MED ORDER — SENNOSIDES-DOCUSATE SODIUM 8.6-50 MG PO TABS: 1.0000 | ORAL_TABLET | Freq: Every evening | ORAL | Status: DC | PRN

## 2015-07-05 MED ORDER — SODIUM CHLORIDE 0.9 % IV SOLN
INTRAVENOUS | Status: DC
  Administered 2015-07-05: 12:00:00 via INTRAVENOUS

## 2015-07-05 MED ORDER — SODIUM CHLORIDE 0.9 % IV SOLN
INTRAVENOUS | Status: DC
  Administered 2015-07-05: 1000 mL via INTRAVENOUS
  Administered 2015-07-06: 04:00:00 via INTRAVENOUS

## 2015-07-05 MED ORDER — CHLORHEXIDINE GLUCONATE CLOTH 2 % EX PADS
6.0000 | MEDICATED_PAD | Freq: Every day | CUTANEOUS | Status: AC
  Administered 2015-07-06 – 2015-07-10 (×5): 6 via TOPICAL

## 2015-07-05 MED ORDER — ENOXAPARIN SODIUM 30 MG/0.3ML ~~LOC~~ SOLN
30.0000 mg | SUBCUTANEOUS | Status: DC
  Administered 2015-07-05: 30 mg via SUBCUTANEOUS
  Filled 2015-07-05: qty 0.3

## 2015-07-05 MED ORDER — MUPIROCIN 2 % EX OINT
1.0000 "application " | TOPICAL_OINTMENT | Freq: Two times a day (BID) | CUTANEOUS | Status: AC
  Administered 2015-07-05 – 2015-07-10 (×9): 1 via NASAL
  Filled 2015-07-05 (×2): qty 22

## 2015-07-05 MED ORDER — TRAZODONE HCL 50 MG PO TABS
25.0000 mg | ORAL_TABLET | Freq: Every evening | ORAL | Status: DC | PRN
  Administered 2015-07-10: 25 mg via ORAL
  Filled 2015-07-05 (×2): qty 1

## 2015-07-05 MED ORDER — CARVEDILOL 3.125 MG PO TABS
3.1250 mg | ORAL_TABLET | Freq: Two times a day (BID) | ORAL | Status: DC
  Administered 2015-07-05: 3.125 mg via ORAL
  Filled 2015-07-05 (×2): qty 1

## 2015-07-05 MED ORDER — SIMETHICONE 80 MG PO CHEW: 80.0000 mg | CHEWABLE_TABLET | Freq: Four times a day (QID) | ORAL | Status: DC | PRN

## 2015-07-05 MED ORDER — ASPIRIN EC 81 MG PO TBEC
81.0000 mg | DELAYED_RELEASE_TABLET | Freq: Every day | ORAL | Status: DC
  Administered 2015-07-05 – 2015-07-11 (×6): 81 mg via ORAL
  Filled 2015-07-05 (×7): qty 1

## 2015-07-05 MED ORDER — HYDROCODONE-ACETAMINOPHEN 5-325 MG PO TABS
1.0000 | ORAL_TABLET | ORAL | Status: DC | PRN
  Administered 2015-07-10: 1 via ORAL
  Administered 2015-07-11: 2 via ORAL
  Filled 2015-07-05: qty 2
  Filled 2015-07-05 (×2): qty 1

## 2015-07-05 MED ORDER — SODIUM CHLORIDE 0.9 % IV BOLUS (SEPSIS)
500.0000 mL | Freq: Once | INTRAVENOUS | Status: AC
  Administered 2015-07-05: 500 mL via INTRAVENOUS

## 2015-07-05 MED ORDER — ACETAMINOPHEN 650 MG RE SUPP: 650.0000 mg | Freq: Four times a day (QID) | RECTAL | Status: DC | PRN

## 2015-07-05 NOTE — ED Notes (Signed)
Patient transported to Ultrasound 

## 2015-07-05 NOTE — ED Provider Notes (Signed)
Medical screening examination/treatment/procedure(s) were conducted as a shared visit with non-physician practitioner(s) and myself.  I personally evaluated the patient during the encounter.  82 yo F w/ h/o cholelithiasis here with pancreatitis, worried for possible choledocholithiasis. My exam, her abdomen is benign. Has a 4/6 systolic murmur. MS intact. Plan for NPO, fluids, anti-emetics, pain control, US and admission.    EKG Interpretation   Date/Time:  Monday July 05 2015 03:39:34 EST Ventricular Rate:  96 PR Interval:  192 QRS Duration: 108 QT Interval:  366 QTC Calculation: 462 R Axis:   -4 Text Interpretation:  Sinus rhythm with occasional Premature ventricular  complexes Left ventricular hypertrophy with repolarization abnormality  Abnormal ECG Confirmed by [facility] HospitalMESNER MD, Barbara CowerJASON [phone] on 07/05/2015 7:11:20 AM        Marily MemosJason Addis Tuohy, MD 07/05/15 [phone]

## 2015-07-05 NOTE — ED Notes (Signed)
Cammy CopaAbigail, PA at bedside with patient/family.

## 2015-07-05 NOTE — Consult Note (Addendum)
[facility] Consult Note  Referring Provider: No ref. provider found Primary Care Physician:  Mayra Neer, MD Primary Gastroenterologist:  Dr.  Laurel Dimmer Complaint: 82 year old white female HPI: KAISLEE CHAO is an 82 y.o. white female  with severe aortic stenosis presents for the second time in 48 hours with epigastric pain nausea and vomiting. On the first occasion she did not have liver function tests or lipase checked and apparently it was felt that her pain might be of cardiac etiology. She was released but returned the next night with even more severe pain nausea and vomiting. She had a lipase of 2693 with an AST 512 ALT 453 and a bilirubin 4.5. She had a normal chemistry panel in October 2016. Ultrasound shows a lobular masslike lesion within the gallbladder did from previous study 2011. Tumefactive sludge versus mass. Common bile ducts distended 810 mm with poorly visualized pancreas. She is currently without pain with some mild epigastric tenderness after being given dilaudid   Past Medical History  Diagnosis Date  . Acid reflux   . Gallstones   . Heart murmur   . Inner ear disease   . HTN (hypertension) 01/09/2015  . GERD (gastroesophageal reflux disease) 01/09/2015  . Hyperlipidemia 01/09/2015  . History of gallstones 01/09/2015  . Anemia 01/09/2015    Past Surgical History  Procedure Laterality Date  . Tonsilectomy, adenoidectomy, bilateral myringotomy and tubes Bilateral   . Appendectomy Bilateral      (Not in a hospital admission)  Allergies:  Allergies  Allergen Reactions  . Amoxicillin Other (See Comments)    Intolerance per son  . Salicylates Other (See Comments)    All nsaids - unknown reaction    Family History  Problem Relation Age of Onset  . Heart failure Mother   . Dementia Father   . Heart attack Father     Social History:  reports that she has never smoked. She does not have any smokeless tobacco history on file. She reports that she  does not drink alcohol or use illicit drugs.  Review of Systems: negative except As above   Blood pressure 107/57, pulse 92, temperature 98.1 F (36.7 C), temperature source Oral, resp. rate 22, height 5' 2"  (1.575 m), weight 75.751 kg (167 lb), SpO2 97 %. Head: Normocephalic, without obvious abnormality, atraumatic Neck: no adenopathy, no carotid bruit, no JVD, supple, symmetrical, trachea midline and thyroid not enlarged, symmetric, no tenderness/mass/nodules Resp: clear to auscultation bilaterally Cardio: regular rate and rhythm, S1, S2 normal, no murmur, click, rub or gallop GI: Abdomen soft nondistended with normoactive bowel sounds. No hepatosplenomegaly mass or guarding. Extremities: extremities normal, atraumatic, no cyanosis or edema  Results for orders placed or performed during the hospital encounter of 07/05/15 (from the past 48 hour(s))  Lipase, blood     Status: Abnormal   Collection Time: 07/05/15  3:50 AM  Result Value Ref Range   Lipase 2693 (H) 11 - 51 U/L    Comment: RESULTS CONFIRMED BY MANUAL DILUTION  Comprehensive metabolic panel     Status: Abnormal   Collection Time: 07/05/15  3:50 AM  Result Value Ref Range   Sodium 136 135 - 145 mmol/L   Potassium 4.5 3.5 - 5.1 mmol/L   Chloride 98 (L) 101 - 111 mmol/L   CO2 25 22 - 32 mmol/L   Glucose, Bld 113 (H) 65 - 99 mg/dL   BUN 36 (H) 6 - 20 mg/dL   Creatinine, Ser 1.76 (H) 0.44 - 1.00 mg/dL   Calcium  9.2 8.9 - 10.3 mg/dL   Total Protein 7.5 6.5 - 8.1 g/dL   Albumin 4.0 3.5 - 5.0 g/dL   AST 512 (H) 15 - 41 U/L   ALT 453 (H) 14 - 54 U/L   Alkaline Phosphatase 217 (H) 38 - 126 U/L   Total Bilirubin 4.5 (H) 0.3 - 1.2 mg/dL   GFR calc non Af Amer 24 (L) >60 mL/min   GFR calc Af Amer 28 (L) >60 mL/min    Comment: (NOTE) The eGFR has been calculated using the CKD EPI equation. This calculation has not been validated in all clinical situations. eGFR's persistently <60 mL/min signify possible Chronic  Kidney Disease.    Anion gap 13 5 - 15  CBC     Status: Abnormal   Collection Time: 07/05/15  3:50 AM  Result Value Ref Range   WBC 13.5 (H) 4.0 - 10.5 K/uL   RBC 3.95 3.87 - 5.11 MIL/uL   Hemoglobin 10.7 (L) 12.0 - 15.0 g/dL   HCT 35.5 (L) 36.0 - 46.0 %   MCV 89.9 78.0 - 100.0 fL   MCH 27.1 26.0 - 34.0 pg   MCHC 30.1 30.0 - 36.0 g/dL   RDW 17.1 (H) 11.5 - 15.5 %   Platelets 154 150 - 400 K/uL  Bilirubin, fractionated(tot/dir/indir)     Status: Abnormal   Collection Time: 07/05/15  3:50 AM  Result Value Ref Range   Total Bilirubin 4.4 (H) 0.3 - 1.2 mg/dL   Bilirubin, Direct 2.6 (H) 0.1 - 0.5 mg/dL   Indirect Bilirubin 1.8 (H) 0.3 - 0.9 mg/dL  I-stat troponin, ED (not at [facility], [facility])     Status: None   Collection Time: 07/05/15  4:08 AM  Result Value Ref Range   Troponin i, poc 0.04 0.00 - 0.08 ng/mL   Comment 3            Comment: Due to the release kinetics of cTnI, a negative result within the first hours of the onset of symptoms does not rule out myocardial infarction with certainty. If myocardial infarction is still suspected, repeat the test at appropriate intervals.   Urinalysis, Routine w reflex microscopic (not at [facility])     Status: Abnormal   Collection Time: 07/05/15  8:39 AM  Result Value Ref Range   Color, Urine AMBER (A) YELLOW    Comment: BIOCHEMICALS MAY BE AFFECTED BY COLOR   APPearance CLOUDY (A) CLEAR   Specific Gravity, Urine 1.024 1.005 - 1.030   pH 5.0 5.0 - 8.0   Glucose, UA NEGATIVE NEGATIVE mg/dL   Hgb urine dipstick MODERATE (A) NEGATIVE   Bilirubin Urine LARGE (A) NEGATIVE   Ketones, ur NEGATIVE NEGATIVE mg/dL   Protein, ur 100 (A) NEGATIVE mg/dL   Nitrite NEGATIVE NEGATIVE   Leukocytes, UA MODERATE (A) NEGATIVE  Urine microscopic-add on     Status: Abnormal   Collection Time: 07/05/15  8:39 AM  Result Value Ref Range   Squamous Epithelial / LPF 6-30 (A) NONE SEEN   WBC, UA 6-30 0 - 5 WBC/hpf   RBC / HPF 0-5 0 - 5 RBC/hpf   Bacteria, UA  MANY (A) NONE SEEN   Casts GRANULAR CAST (A) NEGATIVE   US Abdomen Complete  07/05/2015  CLINICAL DATA:  Generalized abdominal pain, nausea vomiting since Sunday. Elevated lipase. EXAM: ABDOMEN ULTRASOUND COMPLETE COMPARISON:  Ultrasound 09/22/2009 FINDINGS: Gallbladder: Irregular lobular masslike lesion is centrally within the lumen of the gallbladder measures 6 cm x 2.7 cm by  3.0 cm. There is mild posterior shadowing associated this lobular masslike lesion. No significant vascular flow by color Doppler ultrasound. The gallbladder [place] is thickened tube 7 mm. No pericholecystic fluid. Negative sonographic Murphy's sign. Common bile duct: Diameter: Common bile duct is distended to 8 to 10 mm. Liver: No biliary duct dilatation identified. No mass lesion. Normal echogenicity. IVC: No abnormality visualized. Pancreas: Not well visualized Spleen: Size and appearance within normal limits. Right Kidney: Length: 9.8 cm. Echogenicity within normal limits. No mass or hydronephrosis visualized. Left Kidney: Length: 9.6 cm. Echogenicity within normal limits. No mass or hydronephrosis visualized. Abdominal aorta: No aneurysm visualized. Other findings: Bilateral small pleural effusions. IMPRESSION: 1. Lobular masslike lesion within the lumen of the gallbladder new from prior. Differential includes a tumefactive sludge versus gallbladder carcinoma. No vascularity within the mass suggesting favors tumefactive sludge. 2. Dilatation the common bile duct to 8 to 10 mm. 3. With this combination of gallbladder mass and common bile duct dilatation, recommend MRI / MRCP with contrast. If patient is not a good candidate for MRI (i.e. cannot hold breath for 15 to 20 seconds and follow voice commands) then recommend CT of the abdomen pelvis with contrast. Electronically Signed   By: Suzy Bouchard M.D.   On: 07/05/2015 09:55   Dg Chest Port 1 View  07/05/2015  CLINICAL DATA:  Dyspnea EXAM: PORTABLE CHEST 1 VIEW COMPARISON:   02/24/2015 FINDINGS: Cardiomegaly again noted. Central vascular congestion without convincing pulmonary edema. Small right pleural effusion with right basilar atelectasis or infiltrate. Small left basilar atelectasis or infiltrate. Atherosclerotic calcifications of thoracic aorta. IMPRESSION: Central vascular congestion without convincing pulmonary edema. Small right pleural effusion with right basilar atelectasis or infiltrate. Small left basilar atelectasis or infiltrate. Electronically Signed   By: Lahoma Crocker M.D.   On: 07/05/2015 08:02    Assessment: Pancreatitis, presumed biliary rule out retained common bile duct stones Gallbladder mass versus sludge Plan:  IV antibiotics, supportive care and bowel rest. Will attempt MRCP to rule out common bile duct stones. High morbidity anticipated from ERCP or cholecystectomy based on her age and severe cardiac disease. Will follow serial liver function tests and lipase. Will follow with you. Alyza Artiaga C 07/05/2015, 11:45 AM  Pager [phone] If no answer or after 5 PM call [phone]

## 2015-07-05 NOTE — ED Notes (Addendum)
C/o generalized abd pain, nausea, and vomiting since Sunday morning.  Family reports history of gallstones.

## 2015-07-05 NOTE — H&P (Signed)
[facility] History and Physical  Rebecca Woodard:[id number] DOB: 1923/02/19 DOA: 07/05/2015  Referring physician: Emergency Department PCP: Lupita Raider, MD   CHIEF COMPLAINT:                   HPI: Rebecca Woodard is a 82 y.o. female  with history of severe aortic stenosis, combined systolic and diastolic heart failure, hypertension, and cholelithiasis.   Patient was seen in the emergency department yesterday for a syncopal episode. Workup was unremarkable. Syncope felt to be secondary to aortic stenosis. Patient not an operative candidate. She was discharged home  After eating yesterday, patient developed severe, diffuse upper abdominal pain radiating through to her back. She's had 3 episodes of vomiting since onset of pain. Daughter said been rubbing her back to help with the pain which is nearly constant.  No fevers or chills. Patient has known gallstones and she has occasional episodes of upper abdominal pain but now this severe or lasting this long. Last BM yesterday, she is urinating normally.     ED COURSE:           Labs:   BUN 36, Cr 1.76 (baseline 1.19)  Alkaline phosphatase 217, lipase 2693, AST 512, ALT 453, total bilirubin 4.5. Trop 0.04,  BNP 1600  Urinalysis:    clear, many bacteria neg leuk, WBC 0-5         EKG:    Sinus rhythm with occasional Premature ventricular complexes Left ventricular hypertrophy with repolarization abnormality Abnormal ECG Confirmed by [facility] MD, Barbara Cower [phone]) on 07/05/2015 7:11:20 AM                  Medications  sodium chloride 0.9 % bolus 500 mL (not administered)  morphine 4 MG/ML injection 4 mg (not administered)  ondansetron (ZOFRAN) injection 4 mg (not administered)    Review of Systems  Constitutional: Negative.   HENT: Negative.   Eyes: Negative.   Respiratory: Positive for shortness of breath.   Cardiovascular: Positive for leg swelling.  Gastrointestinal: Positive for vomiting and abdominal pain.  Genitourinary:  Negative.   Musculoskeletal: Negative.   Skin: Negative.   Neurological: Positive for loss of consciousness.  Endo/Heme/Allergies: Negative.   Psychiatric/Behavioral: Negative.    Past Medical History  Diagnosis Date  . Acid reflux   . Gallstones   . Heart murmur   . Inner ear disease   . HTN (hypertension) 01/09/2015  . GERD (gastroesophageal reflux disease) 01/09/2015  . Hyperlipidemia 01/09/2015  . History of gallstones 01/09/2015  . Anemia 01/09/2015   Past Surgical History  Procedure Laterality Date  . Tonsilectomy, adenoidectomy, bilateral myringotomy and tubes Bilateral   . Appendectomy Bilateral     SOCIAL HISTORY:  reports that she has never smoked. She does not have any smokeless tobacco history on file. She reports that she does not drink alcohol or use illicit drugs. Lives:   Alone                  Assistive devices:   None needed for ambulation.   Allergies  Allergen Reactions  . Amoxicillin Other (See Comments)    Intolerance per son  . Salicylates Other (See Comments)    All nsaids - unknown reaction    Family History  Problem Relation Age of Onset  . Heart failure Mother   . Dementia Father   . Heart attack Father     Prior to Admission medications   Medication Sig Start Date End Date Taking? Authorizing  Provider  aspirin EC 81 MG EC tablet Take 1 tablet (81 mg total) by mouth daily. 01/11/15   Leroy Sea, MD  carvedilol (COREG) 3.125 MG tablet Take 1 tablet (3.125 mg total) by mouth 2 (two) times daily with a meal. 01/11/15   Leroy Sea, MD  furosemide (LASIX) 40 MG tablet TAKE 1 TABLET (40 MG TOTAL) BY MOUTH DAILY. Patient taking differently: TAKE 1 TABLET (40 MG TOTAL) BY MOUTH DAILY, MAY TAKE ANOTHER TABLET DURING THE DAY IF NEEDED FOR SWELLING/ SHORTNESS OF BREATH 06/16/15   Hao Meng, PA  KLOR-CON M20 20 MEQ tablet TAKE 2 TABLETS (40 MEQ TOTAL) BY MOUTH DAILY. TAKE WITH LASIX TO REPLACE POTASSIUM Patient taking differently: TAKE 1 TABLET BY  MOUTH TWICE DAILY. TAKE WITH LASIX TO REPLACE POTASSIUM 06/16/15   Azalee Course, PA  omeprazole (PRILOSEC) 20 MG capsule Take 20 mg by mouth 2 (two) times daily. 06/16/15   Historical Provider, MD  OVER THE COUNTER MEDICATION Over the counter motion sickness pill - take 1 tablet by mouth every morning and may also take 1 or 2 tablets during the day as needed for nausea    Historical Provider, MD  simethicone (MYLICON) 80 MG chewable tablet Chew 80 mg by mouth every 6 (six) hours as needed for flatulence.    Historical Provider, MD  simvastatin (ZOCOR) 20 MG tablet Take 20 mg by mouth daily at 6 PM.    Historical Provider, MD   PHYSICAL EXAM: Filed Vitals:   07/05/15 0339 07/05/15 0615  BP: 110/53 110/61  Pulse: 95 95  Temp: 98.1 F (36.7 C)   TempSrc: Oral   Resp: 18 20  Height:  (1.575 m)   Weight: 75.751 kg (167 lb)   SpO2: 95% 94%    Wt Readings from Last 3 Encounters:  07/05/15 75.751 kg (167 lb)  05/14/15 74.571 kg (164 lb 6.4 oz)  03/02/15 76.567 kg (168 lb 12.8 oz)    General:  Pleasant  white  female. Appears calm and comfortable Eyes: PER, normal lids, irises & conjunctiva ENT: grossly normal hearing, lips & tongue Neck: no LAD, no masses Cardiovascular: RRR, loud murmur, 2-3+ BLE edema  Respiratory: Respirations even and unlabored. Decreased breath sounds at bilateral bases, a few bibasilar inspiratory crackles.    Abdomen: soft, non-distended, diffusely tender. A few bowel sounds.  No obvious masses.  Skin: no rash seen on limited exam Musculoskeletal: grossly normal tone BUE/BLE Psychiatric: grossly normal mood and affect, speech fluent and appropriate Neurologic: grossly non-focal.         LABS ON ADMISSION:    Basic Metabolic Panel:  Recent Labs Lab 07/03/15 2145 07/05/15 0350  NA 136 136  K 5.1 4.5  CL 99* 98*  CO2 26 25  GLUCOSE 159* 113*  BUN 27* 36*  CREATININE 1.46* 1.76*  CALCIUM 8.8* 9.2   Liver Function Tests:  Recent Labs Lab  07/05/15 0350  AST 512*  ALT 453*  ALKPHOS 217*  BILITOT 4.5*  PROT 7.5  ALBUMIN 4.0    CBC:  Recent Labs Lab 07/03/15 2145 07/05/15 0350  WBC 9.2 13.5*  HGB 10.2* 10.7*  HCT 33.3* 35.5*  MCV 89.3 89.9  PLT 139* 154    BNP (last 3 results)  Recent Labs  01/09/15 1110 02/24/15 0800 07/03/15 2145  BNP 1937.2* 2269.9* 1600.5*    CREATININE: 1.76 mg/dL ABNORMAL (57/84/69 6295) Estimated creatinine clearance - 20.3 mL/min   Echo Sept 2016 EF 45-50%. No formal diagnosis  of diastolic dysfunction but elevated atrial pressures.   ASSESSMENT / PLAN   Pancreatitis, presumably biliary with history of cholelithiasis, abnormal LFTs. Vtals stable, HCT 35. Ultrasound pending. Patient with concurrent acute on chronic combined systolic and diastolic CHF, severe aortic stenosis, and AKI. -admit to Stepdown -ultrasound pending  -Consult Gastroenterology -Needs fluid resus for acute pancreatitis but aware that this could worsen heart failure. Has gotten 500cc bolus in EDP. Will add 125ml for 24 hrs for total of 3.5 liters in 24 hours.  -monitor HCT, renal function -analgesics, anti-emetics -NPO -prn 02  AKI. Cr 1.46 (baseline 1.1). Will hopefully improve with IVF. Lasix on hold.  -avoid nephrotoxic medications -check am bmet  Acute on chronic diastolic heart failure due to valvular disease (HCC). EF 45-50%, -continue Coreg -Failure will likely be worsened by need for IVF.  -Hopefully restart home diuretics in next 24 hours. -Obtain CXR -measure intake / output -daily weights  Aortic stenosis, severe. Poor surgical candidate for AVR or TAVR though Cardiology's last note in January mentioned referral for TAVR.  Syncope episode yesterday. Seen in ED. Episode felt to be related to AS.    HTN (hypertension). Controlled    GERD (gastroesophageal reflux disease).  -continue home PPI.   Hyperlipidemia.  -hold statin for now.    CONSULTANTS:    Gastroenterology  Code  Status: Limited code. No CRP / DNI DVT Prophylaxis: Lovenox  Family Communication:  Patient aert, oriented and understands plan of care.  Disposition Plan: Discharge to home in 2-3 days.   Time spent: 60 minutes Willette ClusterPaula Shamir Sedlar  NP [facility] Pager [phone]

## 2015-07-05 NOTE — ED Provider Notes (Signed)
CSN: [id number]     Arrival date & time 07/05/15  [id number] History   First MD Initiated Contact with Patient 07/05/15 [phone]     Chief Complaint  Patient presents with  . Abdominal Pain     (Consider location/radiation/quality/duration/timing/severity/associated sxs/prior Treatment) HPI  Rebecca Woodard Is a very pleasant 82 year old female who returns to the emergency department with chief complaint of severe nausea and abdominal pain. She has a past medical history of severe aortic stenosis, and gallstones, anemia and hyperlipidemia. The patient was seen yesterday for nausea and syncope and released home. She is attended by her family. The patient is able to provide her own history. She states that she has had severe pain in her epigastrium and left upper quadrant of her abdomen since yesterday with multiple episodes of vomiting, nonbloody, nonbilious vomitus. She states that the pain "make Me Holler". The pain radiates to her mid back. Her family states that they have been rubbing her back without relief. Diarrhea or constipation. She does feel that her abdomen is bloated. The pain worsened yesterday after eating a sausage biscuit at [employer]. Patient denies chest pain or shortness of breath. She denied fevers, chills, myalgias, urinary symptoms. It was unable to hold down her medications this morning.  Past Medical History  Diagnosis Date  . Acid reflux   . Gallstones   . Heart murmur   . Inner ear disease   . HTN (hypertension) 01/09/2015  . GERD (gastroesophageal reflux disease) 01/09/2015  . Hyperlipidemia 01/09/2015  . History of gallstones 01/09/2015  . Anemia 01/09/2015   Past Surgical History  Procedure Laterality Date  . Tonsilectomy, adenoidectomy, bilateral myringotomy and tubes Bilateral   . Appendectomy Bilateral    Family History  Problem Relation Age of Onset  . Heart failure Mother   . Dementia Father   . Heart attack Father    Social History  Substance Use Topics  .  Smoking status: Never Smoker   . Smokeless tobacco: None  . Alcohol Use: No   OB History    No data available     Review of Systems  Ten systems reviewed and are negative for acute change, except as noted in the HPI.    Allergies  Amoxicillin and Salicylates  Home Medications   Prior to Admission medications   Medication Sig Start Date End Date Taking? Authorizing Provider  aspirin EC 81 MG EC tablet Take 1 tablet (81 mg total) by mouth daily. 01/11/15   Leroy SeaPrashant K Singh, MD  carvedilol (COREG) 3.125 MG tablet Take 1 tablet (3.125 mg total) by mouth 2 (two) times daily with a meal. 01/11/15   Leroy SeaPrashant K Singh, MD  furosemide (LASIX) 40 MG tablet TAKE 1 TABLET (40 MG TOTAL) BY MOUTH DAILY. Patient taking differently: TAKE 1 TABLET (40 MG TOTAL) BY MOUTH DAILY, MAY TAKE ANOTHER TABLET DURING THE DAY IF NEEDED FOR SWELLING/ SHORTNESS OF BREATH 06/16/15   Hao Meng, PA  KLOR-CON M20 20 MEQ tablet TAKE 2 TABLETS (40 MEQ TOTAL) BY MOUTH DAILY. TAKE WITH LASIX TO REPLACE POTASSIUM Patient taking differently: TAKE 1 TABLET BY MOUTH TWICE DAILY. TAKE WITH LASIX TO REPLACE POTASSIUM 06/16/15   Azalee CourseHao Meng, PA  omeprazole (PRILOSEC) 20 MG capsule Take 20 mg by mouth 2 (two) times daily. 06/16/15   Historical Provider, MD  OVER THE COUNTER MEDICATION Over the counter motion sickness pill - take 1 tablet by mouth every morning and may also take 1 or 2 tablets during the day as  needed for nausea    Historical Provider, MD  simethicone (MYLICON) 80 MG chewable tablet Chew 80 mg by mouth every 6 (six) hours as needed for flatulence.    Historical Provider, MD  simvastatin (ZOCOR) 20 MG tablet Take 20 mg by mouth daily at 6 PM.    Historical Provider, MD   BP 110/61 mmHg  Pulse 95  Temp(Src) 98.1 F (36.7 C) (Oral)  Resp 20  Ht  (1.575 m)  Wt 75.751 kg  BMI 30.54 kg/m2  SpO2 94% Physical Exam  Constitutional: She is oriented to person, place, and time. She appears well-developed and  well-nourished. No distress.  HENT:  Head: Normocephalic and atraumatic.  Eyes: Conjunctivae are normal. No scleral icterus.  Neck: Normal range of motion.  Cardiovascular: Normal rate and regular rhythm.  Exam reveals no gallop and no friction rub.   Murmur heard. Loud crescendo-decrescendo systolic murmur heard best at R 2nd ICS  Pulmonary/Chest: Effort normal and breath sounds normal. No respiratory distress.  Abdominal: Soft. Bowel sounds are normal. She exhibits distension. She exhibits no mass. There is tenderness. There is no guarding.    Neurological: She is alert and oriented to person, place, and time.  Skin: Skin is warm and dry. She is not diaphoretic.  Nursing note and vitals reviewed.   ED Course  Procedures (including critical care time) Labs Review Labs Reviewed  LIPASE, BLOOD - Abnormal; Notable for the following:    Lipase 2693 (*)    All other components within normal limits  COMPREHENSIVE METABOLIC PANEL - Abnormal; Notable for the following:    Chloride 98 (*)    Glucose, Bld 113 (*)    BUN 36 (*)    Creatinine, Ser 1.76 (*)    AST 512 (*)    ALT 453 (*)    Alkaline Phosphatase 217 (*)    Total Bilirubin 4.5 (*)    GFR calc non Af Amer 24 (*)    GFR calc Af Amer 28 (*)    All other components within normal limits  CBC - Abnormal; Notable for the following:    WBC 13.5 (*)    Hemoglobin 10.7 (*)    HCT 35.5 (*)    RDW 17.1 (*)    All other components within normal limits  URINALYSIS, ROUTINE W REFLEX MICROSCOPIC (NOT AT [facility])  I-STAT TROPOININ, ED    Imaging Review No results found. I have personally reviewed and evaluated these images and lab results as part of my medical decision-making.   EKG Interpretation   Date/Time:  Monday July 05 2015 03:39:34 EST Ventricular Rate:  96 PR Interval:  192 QRS Duration: 108 QT Interval:  366 QTC Calculation: 462 R Axis:   -4 Text Interpretation:  Sinus rhythm with occasional Premature ventricular    complexes Left ventricular hypertrophy with repolarization abnormality  Abnormal ECG Confirmed by [facility] MD, Barbara Cower [phone]) on 07/05/2015 7:11:20 AM      MDM   Final diagnoses:  Acute pancreatitis, unspecified pancreatitis type  Elevated liver enzymes  Elevated bilirubin  Leukocytosis    7:14 AM BP 110/61 mmHg  Pulse 95  Temp(Src) 98.1 F (36.7 C) (Oral)  Resp 20  Ht  (1.575 m)  Wt 75.751 kg  BMI 30.54 kg/m2  SpO2 94% Patient with Pancreatitis, elevated liver enzymes. I suspect gallstone pancreatitis versus mass. The patient has severe aortic stenosis will need aggressive fluid hydration. I spoken with the hospitalist. Patient will be admitted to stepdown. Have ordered  a bilirubin fractionation. EKG shows tachycardia. Yesterday her EKG showed A. Fib. She has a right upper quadrant ultrasound ordered and pending. Patient seen in shared visit with attending physician. Pain control and fluid rehydration initiated. She appears safe for admission at this time.    Arthor Captain, PA-C 07/05/15 [id number]  Marily Memos, MD 07/05/15 [phone]

## 2015-07-06 DIAGNOSIS — K8050 Calculus of bile duct without cholangitis or cholecystitis without obstruction: Secondary | ICD-10-CM | POA: Diagnosis present

## 2015-07-06 LAB — BASIC METABOLIC PANEL
Anion gap: 12 (ref 5–15)
BUN: 42 mg/dL — ABNORMAL HIGH (ref 6–20)
CALCIUM: 8.7 mg/dL — AB (ref 8.9–10.3)
CO2: 22 mmol/L (ref 22–32)
CREATININE: 1.63 mg/dL — AB (ref 0.44–1.00)
Chloride: 104 mmol/L (ref 101–111)
GFR, EST AFRICAN AMERICAN: 31 mL/min — AB (ref >60)
GFR, EST NON AFRICAN AMERICAN: 27 mL/min — AB (ref >60)
GLUCOSE: 92 mg/dL (ref 65–99)
Potassium: 4.7 mmol/L (ref 3.5–5.1)
Sodium: 138 mmol/L (ref 135–145)

## 2015-07-06 LAB — LIPASE, BLOOD: Lipase: 1203 U/L — ABNORMAL HIGH (ref 11–51)

## 2015-07-06 LAB — CBC
HCT: 28.6 % — ABNORMAL LOW (ref 36.0–46.0)
HEMOGLOBIN: 8.9 g/dL — AB (ref 12.0–15.0)
MCH: 28.2 pg (ref 26.0–34.0)
MCHC: 31.1 g/dL (ref 30.0–36.0)
MCV: 90.5 fL (ref 78.0–100.0)
PLATELETS: 111 10*3/uL — AB (ref 150–400)
RBC: 3.16 MIL/uL — ABNORMAL LOW (ref 3.87–5.11)
RDW: 17.5 % — ABNORMAL HIGH (ref 11.5–15.5)
WBC: 8.4 10*3/uL (ref 4.0–10.5)

## 2015-07-06 LAB — HEPATIC FUNCTION PANEL
ALBUMIN: 3.1 g/dL — AB (ref 3.5–5.0)
ALT: 312 U/L — ABNORMAL HIGH (ref 14–54)
AST: 255 U/L — ABNORMAL HIGH (ref 15–41)
Alkaline Phosphatase: 178 U/L — ABNORMAL HIGH (ref 38–126)
BILIRUBIN DIRECT: 2.6 mg/dL — AB (ref 0.1–0.5)
BILIRUBIN INDIRECT: 1.4 mg/dL — AB (ref 0.3–0.9)
BILIRUBIN TOTAL: 4 mg/dL — AB (ref 0.3–1.2)
TOTAL PROTEIN: 6.1 g/dL — AB (ref 6.5–8.1)

## 2015-07-06 MED ORDER — CEFTRIAXONE SODIUM 2 G IJ SOLR
2.0000 g | INTRAMUSCULAR | Status: DC
  Administered 2015-07-06 – 2015-07-10 (×4): 2 g via INTRAVENOUS
  Filled 2015-07-06 (×5): qty 2

## 2015-07-06 MED ORDER — METRONIDAZOLE IN NACL 5-0.79 MG/ML-% IV SOLN
500.0000 mg | Freq: Three times a day (TID) | INTRAVENOUS | Status: DC
  Administered 2015-07-06 – 2015-07-10 (×13): 500 mg via INTRAVENOUS
  Filled 2015-07-06 (×14): qty 100

## 2015-07-06 MED ORDER — PANTOPRAZOLE SODIUM 40 MG IV SOLR
40.0000 mg | INTRAVENOUS | Status: DC
Start: 2015-07-06 — End: 2015-07-11
  Administered 2015-07-06 – 2015-07-10 (×5): 40 mg via INTRAVENOUS
  Filled 2015-07-06 (×6): qty 40

## 2015-07-06 MED ORDER — ONDANSETRON HCL 4 MG/2ML IJ SOLN
4.0000 mg | Freq: Four times a day (QID) | INTRAMUSCULAR | Status: DC | PRN
  Administered 2015-07-06 – 2015-07-10 (×13): 4 mg via INTRAVENOUS
  Filled 2015-07-06 (×13): qty 2

## 2015-07-06 NOTE — Plan of Care (Signed)
Problem: Safety: Goal: Ability to remain free from injury will improve Outcome: Progressing Pt is up in the chair with yellow gripper socks on/ call-light near. Family is at the bedside continuously. Pt is alert and aware of when to call for assistance. Family aware, also.

## 2015-07-06 NOTE — Progress Notes (Signed)
Utilization Review Completed.Nirav Sweda T3/10/2015  

## 2015-07-06 NOTE — Clinical Documentation Improvement (Addendum)
Internal Medicine   Can the diagnosis of Hyperlipidemia be further specified by type? Thank you       Combined (e.g.familial)  Group  Mixed  Other  Clinically Undetermined  Other  Clinically Undetermined   Please exercise your independent, professional judgment when responding. A specific answer is not anticipated or expected.   Thank You, Lavonda JumboLawanda J Alessandria [facility] [employer] [facility] [place] [phone]

## 2015-07-06 NOTE — Progress Notes (Signed)
[facility] Progress Note  Subjective: Abdominal pain better, no vomiting  Objective: Vital signs in last 24 hours: Temp:  [97.4 F (36.3 C)-98.4 F (36.9 C)] 97.8 F (36.6 C) (03/07 1215) Pulse Rate:  [67-87] 76 (03/07 1215) Resp:  [12-25] 18 (03/07 1215) BP: (91-104)/(47-61) 92/47 mmHg (03/07 1215) SpO2:  [95 %-100 %] 97 % (03/07 1215) Weight:  [75.1 kg (165 lb 9.1 oz)-76.3 kg (168 lb 3.4 oz)] 76.3 kg (168 lb 3.4 oz) (03/07 0500) Weight change: -0.651 kg (-1 lb 7 oz)   [id number]  Lab Results: Results for orders placed or performed during the hospital encounter of 07/05/15 (from the past 24 hour(s))  MRSA PCR Screening     Status: Abnormal   Collection Time: 07/05/15  5:45 PM  Result Value Ref Range   MRSA by PCR POSITIVE (A) NEGATIVE  Basic metabolic panel     Status: Abnormal   Collection Time: 07/06/15  2:40 AM  Result Value Ref Range   Sodium 138 135 - 145 mmol/L   Potassium 4.7 3.5 - 5.1 mmol/L   Chloride 104 101 - 111 mmol/L   CO2 22 22 - 32 mmol/L   Glucose, Bld 92 65 - 99 mg/dL   BUN 42 (H) 6 - 20 mg/dL   Creatinine, Ser 9.81 (H) 0.44 - 1.00 mg/dL   Calcium 8.7 (L) 8.9 - 10.3 mg/dL   GFR calc non Af Amer 27 (L) >60 mL/min   GFR calc Af Amer 31 (L) >60 mL/min   Anion gap 12 5 - 15  CBC     Status: Abnormal   Collection Time: 07/06/15  2:40 AM  Result Value Ref Range   WBC 8.4 4.0 - 10.5 K/uL   RBC 3.16 (L) 3.87 - 5.11 MIL/uL   Hemoglobin 8.9 (L) 12.0 - 15.0 g/dL   HCT 19.1 (L) 47.8 - 29.5 %   MCV 90.5 78.0 - 100.0 fL   MCH 28.2 26.0 - 34.0 pg   MCHC 31.1 30.0 - 36.0 g/dL   RDW 62.1 (H) 30.8 - 65.7 %   Platelets 111 (L) 150 - 400 K/uL  Hepatic function panel     Status: Abnormal   Collection Time: 07/06/15  2:40 AM  Result Value Ref Range   Total Protein 6.1 (L) 6.5 - 8.1 g/dL   Albumin 3.1 (L) 3.5 - 5.0 g/dL   AST 846 (H) 15 - 41 U/L   ALT 312 (H) 14 - 54 U/L   Alkaline Phosphatase 178 (H) 38 - 126 U/L   Total Bilirubin 4.0 (H) 0.3 -  1.2 mg/dL   Bilirubin, Direct 2.6 (H) 0.1 - 0.5 mg/dL   Indirect Bilirubin 1.4 (H) 0.3 - 0.9 mg/dL  Lipase, blood     Status: Abnormal   Collection Time: 07/06/15  2:40 AM  Result Value Ref Range   Lipase 1203 (H) 11 - 51 U/L    Studies/Results: US Abdomen Complete  07/05/2015  CLINICAL DATA:  Generalized abdominal pain, nausea vomiting since Sunday. Elevated lipase. EXAM: ABDOMEN ULTRASOUND COMPLETE COMPARISON:  Ultrasound 09/22/2009 FINDINGS: Gallbladder: Irregular lobular masslike lesion is centrally within the lumen of the gallbladder measures 6 cm x 2.7 cm by 3.0 cm. There is mild posterior shadowing associated this lobular masslike lesion. No significant vascular flow by color Doppler ultrasound. The gallbladder Bartelt is thickened tube 7 mm. No pericholecystic fluid. Negative sonographic Murphy's sign. Common bile duct: Diameter: Common bile duct is distended to 8 to 10 mm. Liver: No biliary duct  dilatation identified. No mass lesion. Normal echogenicity. IVC: No abnormality visualized. Pancreas: Not well visualized Spleen: Size and appearance within normal limits. Right Kidney: Length: 9.8 cm. Echogenicity within normal limits. No mass or hydronephrosis visualized. Left Kidney: Length: 9.6 cm. Echogenicity within normal limits. No mass or hydronephrosis visualized. Abdominal aorta: No aneurysm visualized. Other findings: Bilateral small pleural effusions. IMPRESSION: 1. Lobular masslike lesion within the lumen of the gallbladder new from prior. Differential includes a tumefactive sludge versus gallbladder carcinoma. No vascularity within the mass suggesting favors tumefactive sludge. 2. Dilatation the common bile duct to 8 to 10 mm. 3. With this combination of gallbladder mass and common bile duct dilatation, recommend MRI / MRCP with contrast. If patient is not a good candidate for MRI (i.e. cannot hold breath for 15 to 20 seconds and follow voice commands) then recommend CT of the abdomen pelvis  with contrast. Electronically Signed   By: Genevive BiStewart  Edmunds M.D.   On: 07/05/2015 09:55   Mr Abdomen Mrcp Wo Cm  07/06/2015  CLINICAL DATA:  82 year old female with severe aortic stenosis presenting 2 times in the past 48 hours with epigastric pain common nausea and vomiting. Elevated lipase and elevated liver function tests. Abnormal gallbladder ultrasound demonstrating mass versus tumefactive sludge. EXAM: MRI ABDOMEN WITHOUT CONTRAST  (INCLUDING MRCP) TECHNIQUE: Multiplanar multisequence MR imaging of the abdomen was performed. Heavily T2-weighted images of the biliary and pancreatic ducts were obtained, and three-dimensional MRCP images were rendered by post processing. COMPARISON:  No prior abdominal MRI. Abdominal ultrasound 07/05/2015. FINDINGS: Comment: Study is severely limited by lack of IV gadolinium for detection of visceral and/or vascular abnormalities. Lower chest: Increased T2 signal intensity throughout the visualize right lower lobe, and to a lesser extent in portions of the left lower lobe. This is nonspecific, and may reflect underlying atelectasis and/or airspace consolidation. Small right and trace left pleural effusions. Cardiomegaly appear Hepatobiliary: No definite cystic or solid appearing hepatic lesions are identified on today's noncontrast examination. Diffuse periportal T2 hyperintensity, compatible with periportal edema. MRCP images demonstrate no intrahepatic biliary ductal dilatation. Common bile duct is mildly dilated measuring up to 9 mm in the porta hepatis. There appear to be some filling defects in the common bile duct, concerning for choledocholithiasis, largest of which measures approximately 6 mm (image 18 of series 8). MRCP images demonstrate little to no intrahepatic biliary ductal dilatation at this time. There is irregular material filling much of the gallbladder. This is T1 isointense and T2 hypointense, but is incompletely evaluated without IV gadolinium, and is  favored to reflect a combination of tumefactive sludge and/or gallstones, however, underlying mass is difficult to entirely exclude. Notably, the gallbladder does not appear distended. Gallbladder Johann appears normal in thickness. No pericholecystic fluid. Pancreas: Pancreas appears morphologically normal. MRCP images demonstrate no pancreatic ductal dilatation. Trace amount of T2 hyperintensity in the peripancreatic fat may reflect inflammation. Node large peripancreatic fluid collection identified at this time. Spleen: Unremarkable. Adrenals/Urinary Tract: Bilateral kidneys and bilateral adrenal glands are normal in appearance. No hydroureteronephrosis in the visualized portions of the abdomen. Stomach/Bowel: Visualized portions are unremarkable. Vascular/Lymphatic: No aneurysm identified in the visualized abdominal vasculature. No lymphadenopathy noted in the abdomen. Other: Large mass in the lower abdomen and upper pelvis incompletely visualized, favored to represent a fibroid uterus. Although incompletely visualize, this mass measures up to 8.7 cm in diameter. No significant volume of ascites in the visualized peritoneal cavity. Musculoskeletal: No aggressive osseous lesions are identified in the visualized portions of the  skeleton. IMPRESSION: 1. Study is limited by lack of IV gadolinium. With these limitations in mind, the material filling much of the gallbladder is favored to reflect a combination of tumefactive sludge and small gallstones. Given the relatively normal appearance of the gallbladder Aldaco, gallbladder carcinoma is not strongly favored at this time. 2. There are some small filling defects in the common bile duct, compatible with choledocholithiasis, and the common bile duct is mildly dilated measuring 9 mm in the porta hepatis. This is associated with very mild intrahepatic biliary ductal dilatation. 3. Small amount of T2 hyperintensity in the peripancreatic fat likely reflects underlying  pancreatitis. No large peripancreatic fluid collection is noted to suggest pseudocyst at this time. 4. Small right and trace left pleural effusions with areas of increased signal intensity in the visualize lung bases which may reflect atelectasis and/or airspace consolidation. 5. Incompletely visualized mass in the pelvis. While this is favored to represent a fibroid uterus, this does warrant further evaluation with nonemergent pelvic ultrasound in the near future. 6. Additional incidental findings, as above. Electronically Signed   By: Trudie Reed M.D.   On: 07/06/2015 07:55   Mr 3d Recon At Scanner  07/06/2015  CLINICAL DATA:  82 year old female with severe aortic stenosis presenting 2 times in the past 48 hours with epigastric pain common nausea and vomiting. Elevated lipase and elevated liver function tests. Abnormal gallbladder ultrasound demonstrating mass versus tumefactive sludge. EXAM: MRI ABDOMEN WITHOUT CONTRAST  (INCLUDING MRCP) TECHNIQUE: Multiplanar multisequence MR imaging of the abdomen was performed. Heavily T2-weighted images of the biliary and pancreatic ducts were obtained, and three-dimensional MRCP images were rendered by post processing. COMPARISON:  No prior abdominal MRI. Abdominal ultrasound 07/05/2015. FINDINGS: Comment: Study is severely limited by lack of IV gadolinium for detection of visceral and/or vascular abnormalities. Lower chest: Increased T2 signal intensity throughout the visualize right lower lobe, and to a lesser extent in portions of the left lower lobe. This is nonspecific, and may reflect underlying atelectasis and/or airspace consolidation. Small right and trace left pleural effusions. Cardiomegaly appear Hepatobiliary: No definite cystic or solid appearing hepatic lesions are identified on today's noncontrast examination. Diffuse periportal T2 hyperintensity, compatible with periportal edema. MRCP images demonstrate no intrahepatic biliary ductal dilatation.  Common bile duct is mildly dilated measuring up to 9 mm in the porta hepatis. There appear to be some filling defects in the common bile duct, concerning for choledocholithiasis, largest of which measures approximately 6 mm (image 18 of series 8). MRCP images demonstrate little to no intrahepatic biliary ductal dilatation at this time. There is irregular material filling much of the gallbladder. This is T1 isointense and T2 hypointense, but is incompletely evaluated without IV gadolinium, and is favored to reflect a combination of tumefactive sludge and/or gallstones, however, underlying mass is difficult to entirely exclude. Notably, the gallbladder does not appear distended. Gallbladder Kehm appears normal in thickness. No pericholecystic fluid. Pancreas: Pancreas appears morphologically normal. MRCP images demonstrate no pancreatic ductal dilatation. Trace amount of T2 hyperintensity in the peripancreatic fat may reflect inflammation. Node large peripancreatic fluid collection identified at this time. Spleen: Unremarkable. Adrenals/Urinary Tract: Bilateral kidneys and bilateral adrenal glands are normal in appearance. No hydroureteronephrosis in the visualized portions of the abdomen. Stomach/Bowel: Visualized portions are unremarkable. Vascular/Lymphatic: No aneurysm identified in the visualized abdominal vasculature. No lymphadenopathy noted in the abdomen. Other: Large mass in the lower abdomen and upper pelvis incompletely visualized, favored to represent a fibroid uterus. Although incompletely visualize, this mass measures  up to 8.7 cm in diameter. No significant volume of ascites in the visualized peritoneal cavity. Musculoskeletal: No aggressive osseous lesions are identified in the visualized portions of the skeleton. IMPRESSION: 1. Study is limited by lack of IV gadolinium. With these limitations in mind, the material filling much of the gallbladder is favored to reflect a combination of tumefactive  sludge and small gallstones. Given the relatively normal appearance of the gallbladder Reish, gallbladder carcinoma is not strongly favored at this time. 2. There are some small filling defects in the common bile duct, compatible with choledocholithiasis, and the common bile duct is mildly dilated measuring 9 mm in the porta hepatis. This is associated with very mild intrahepatic biliary ductal dilatation. 3. Small amount of T2 hyperintensity in the peripancreatic fat likely reflects underlying pancreatitis. No large peripancreatic fluid collection is noted to suggest pseudocyst at this time. 4. Small right and trace left pleural effusions with areas of increased signal intensity in the visualize lung bases which may reflect atelectasis and/or airspace consolidation. 5. Incompletely visualized mass in the pelvis. While this is favored to represent a fibroid uterus, this does warrant further evaluation with nonemergent pelvic ultrasound in the near future. 6. Additional incidental findings, as above. Electronically Signed   By: Trudie Reed M.D.   On: 07/06/2015 07:55   Dg Chest Port 1 View  07/05/2015  CLINICAL DATA:  Dyspnea EXAM: PORTABLE CHEST 1 VIEW COMPARISON:  02/24/2015 FINDINGS: Cardiomegaly again noted. Central vascular congestion without convincing pulmonary edema. Small right pleural effusion with right basilar atelectasis or infiltrate. Small left basilar atelectasis or infiltrate. Atherosclerotic calcifications of thoracic aorta. IMPRESSION: Central vascular congestion without convincing pulmonary edema. Small right pleural effusion with right basilar atelectasis or infiltrate. Small left basilar atelectasis or infiltrate. Electronically Signed   By: Natasha Mead M.D.   On: 07/05/2015 08:02      Assessment: Gallstone pancreatitis Common bile duct stones  Plan: Continue supportive care Will discuss with pt and family relative risk/benefit of ERCP tomorrow and possibly proceed on  3/9.     Florance Paolillo C 07/06/2015, 1:53 PM  Pager [phone] If no answer or after 5 PM call [phone]

## 2015-07-06 NOTE — Progress Notes (Addendum)
[facility] PROGRESS NOTE  Rebecca Woodard JXB:[id number] DOB: 1922/12/21 DOA: 07/05/2015 PCP: Lupita Raider, MD  Assessment/Plan:  Principal Problem:   Acute gallstone pancreatitis: pain, vomiting resolved. No lfts or lipase today. MRCP shows no evidence of gallbladder neoplasm. Area seen on ultrasound likely stone and sludge. Does confirm common duct stones. Antibiotics were not started. Will start ceftriaxone on Flagyl. Keep nothing by mouth. Likely Needs ERCP, await GI recommendations. Keep npo Active Problems:   HTN (hypertension): Normotensive off medications.    GERD (gastroesophageal reflux disease): Change Protonix to IV while nothing by mouth.    Hyperlipidemia   Aortic stenosis, severe, nonoperative  chronic diastolic heart failure: Despite H&P report, appears euvolemic at this time. No dyspnea, rales or respiratory distress. Weight is stable. Will decrease IV to [facility]. BNP is chronically elevated.    AKI (acute kidney injury) (HCC), CKD 2   Choledocholithiasis   Code Status:   partial, meds only  Family Communication:   daughters at bedside  Disposition Plan:  Await GI recs  Consultants:  GI  Procedures:     Antibiotics:    HPI/Subjective:  no pain. No nausea. No dyspnea. Thirsty.   Objective: Filed Vitals:   07/06/15 0400 07/06/15 0600  BP: 97/53 103/51  Pulse: 73 75  Temp: 98.4 F (36.9 C)   Resp: 12 13    Intake/Output Summary (Last 24 hours) at 07/06/15 0935 Last data filed at 07/06/15 0700  Gross per 24 hour  Intake 881.25 ml  Output    200 ml  Net 681.25 ml   Filed Weights   07/05/15 0339 07/05/15 1800 07/06/15 0500  Weight: 75.751 kg (167 lb) 75.1 kg (165 lb 9.1 oz) 76.3 kg (168 lb 3.4 oz)    Exam:   General:  comfortable. Alert oriented and appropriate   Cardiovascular:  regular rate rhythm with harsh systolic murmur   RespiratClear to auscultation bilaterally without wheeze rhonchi or rales  Abdomen: Soft nontender  nondistended. Normal bowel sounds   Ext:  no clubbing cyanosis or edema   Basic Metabolic Panel:  Recent Labs Lab 07/03/15 2145 07/05/15 0350 07/06/15 0240  NA 136 136 138  K 5.1 4.5 4.7  CL 99* 98* 104  CO2 26 25 22   GLUCOSE 159* 113* 92  BUN 27* 36* 42*  CREATININE 1.46* 1.76* 1.63*  CALCIUM 8.8* 9.2 8.7*   Liver Function Tests:  Recent Labs Lab 07/05/15 0350  AST 512*  ALT 453*  ALKPHOS 217*  BILITOT 4.4*  4.5*  PROT 7.5  ALBUMIN 4.0    Recent Labs Lab 07/05/15 0350  LIPASE 2693*   No results for input(s): AMMONIA in the last 168 hours. CBC:  Recent Labs Lab 07/03/15 2145 07/05/15 0350 07/06/15 0240  WBC 9.2 13.5* 8.4  HGB 10.2* 10.7* 8.9*  HCT 33.3* 35.5* 28.6*  MCV 89.3 89.9 90.5  PLT 139* 154 111*   Cardiac Enzymes: No results for input(s): CKTOTAL, CKMB, CKMBINDEX, TROPONINI in the last 168 hours. BNP (last 3 results)  Recent Labs  01/09/15 1110 02/24/15 0800 07/03/15 2145  BNP 1937.2* 2269.9* 1600.5*    ProBNP (last 3 results) No results for input(s): PROBNP in the last 8760 hours.  CBG:  Recent Labs Lab 07/03/15 2200  GLUCAP 152*    Recent Results (from the past 240 hour(s))  MRSA PCR Screening     Status: Abnormal   Collection Time: 07/05/15  5:45 PM  Result Value Ref Range Status   MRSA by PCR POSITIVE (A)  NEGATIVE Final    Comment:        The GeneXpert MRSA Assay (FDA approved for NASAL specimens only), is one component of a comprehensive MRSA colonization surveillance program. It is not intended to diagnose MRSA infection nor to guide or monitor treatment for MRSA infections. RESULT CALLED TO, READ BACK BY AND VERIFIED WITH: Devoria AlbeM MASON RN [id number] 07/05/15 A BROWNING      Studies: [place] Abdomen Complete  07/05/2015  CLINICAL DATA:  Generalized abdominal pain, nausea vomiting since Sunday. Elevated lipase. EXAM: ABDOMEN ULTRASOUND COMPLETE COMPARISON:  Ultrasound 09/22/2009 FINDINGS: Gallbladder: Irregular lobular  masslike lesion is centrally within the lumen of the gallbladder measures 6 cm x 2.7 cm by 3.0 cm. There is mild posterior shadowing associated this lobular masslike lesion. No significant vascular flow by color Doppler ultrasound. The gallbladder [place] is thickened tube 7 mm. No pericholecystic fluid. Negative sonographic Murphy's sign. Common bile duct: Diameter: Common bile duct is distended to 8 to 10 mm. Liver: No biliary duct dilatation identified. No mass lesion. Normal echogenicity. IVC: No abnormality visualized. Pancreas: Not well visualized Spleen: Size and appearance within normal limits. Right Kidney: Length: 9.8 cm. Echogenicity within normal limits. No mass or hydronephrosis visualized. Left Kidney: Length: 9.6 cm. Echogenicity within normal limits. No mass or hydronephrosis visualized. Abdominal aorta: No aneurysm visualized. Other findings: Bilateral small pleural effusions. IMPRESSION: 1. Lobular masslike lesion within the lumen of the gallbladder new from prior. Differential includes a tumefactive sludge versus gallbladder carcinoma. No vascularity within the mass suggesting favors tumefactive sludge. 2. Dilatation the common bile duct to 8 to 10 mm. 3. With this combination of gallbladder mass and common bile duct dilatation, recommend MRI / MRCP with contrast. If patient is not a good candidate for MRI (i.e. cannot hold breath for 15 to 20 seconds and follow voice commands) then recommend CT of the abdomen pelvis with contrast. Electronically Signed   By: Genevive BiStewart  Edmunds M.D.   On: 07/05/2015 09:55   Mr Abdomen Mrcp Wo Cm  07/06/2015  CLINICAL DATA:  82 year old female with severe aortic stenosis presenting 2 times in the past 48 hours with epigastric pain common nausea and vomiting. Elevated lipase and elevated liver function tests. Abnormal gallbladder ultrasound demonstrating mass versus tumefactive sludge. EXAM: MRI ABDOMEN WITHOUT CONTRAST  (INCLUDING MRCP) TECHNIQUE: Multiplanar  multisequence MR imaging of the abdomen was performed. Heavily T2-weighted images of the biliary and pancreatic ducts were obtained, and three-dimensional MRCP images were rendered by post processing. COMPARISON:  No prior abdominal MRI. Abdominal ultrasound 07/05/2015. FINDINGS: Comment: Study is severely limited by lack of IV gadolinium for detection of visceral and/or vascular abnormalities. Lower chest: Increased T2 signal intensity throughout the visualize right lower lobe, and to a lesser extent in portions of the left lower lobe. This is nonspecific, and may reflect underlying atelectasis and/or airspace consolidation. Small right and trace left pleural effusions. Cardiomegaly appear Hepatobiliary: No definite cystic or solid appearing hepatic lesions are identified on today's noncontrast examination. Diffuse periportal T2 hyperintensity, compatible with periportal edema. MRCP images demonstrate no intrahepatic biliary ductal dilatation. Common bile duct is mildly dilated measuring up to 9 mm in the porta hepatis. There appear to be some filling defects in the common bile duct, concerning for choledocholithiasis, largest of which measures approximately 6 mm (image 18 of series 8). MRCP images demonstrate little to no intrahepatic biliary ductal dilatation at this time. There is irregular material filling much of the gallbladder. This is T1 isointense and T2 hypointense,  but is incompletely evaluated without IV gadolinium, and is favored to reflect a combination of tumefactive sludge and/or gallstones, however, underlying mass is difficult to entirely exclude. Notably, the gallbladder does not appear distended. Gallbladder [place] appears normal in thickness. No pericholecystic fluid. Pancreas: Pancreas appears morphologically normal. MRCP images demonstrate no pancreatic ductal dilatation. Trace amount of T2 hyperintensity in the peripancreatic fat may reflect inflammation. Node large peripancreatic fluid  collection identified at this time. Spleen: Unremarkable. Adrenals/Urinary Tract: Bilateral kidneys and bilateral adrenal glands are normal in appearance. No hydroureteronephrosis in the visualized portions of the abdomen. Stomach/Bowel: Visualized portions are unremarkable. Vascular/Lymphatic: No aneurysm identified in the visualized abdominal vasculature. No lymphadenopathy noted in the abdomen. Other: Large mass in the lower abdomen and upper pelvis incompletely visualized, favored to represent a fibroid uterus. Although incompletely visualize, this mass measures up to 8.7 cm in diameter. No significant volume of ascites in the visualized peritoneal cavity. Musculoskeletal: No aggressive osseous lesions are identified in the visualized portions of the skeleton. IMPRESSION: 1. Study is limited by lack of IV gadolinium. With these limitations in mind, the material filling much of the gallbladder is favored to reflect a combination of tumefactive sludge and small gallstones. Given the relatively normal appearance of the gallbladder [place], gallbladder carcinoma is not strongly favored at this time. 2. There are some small filling defects in the common bile duct, compatible with choledocholithiasis, and the common bile duct is mildly dilated measuring 9 mm in the porta hepatis. This is associated with very mild intrahepatic biliary ductal dilatation. 3. Small amount of T2 hyperintensity in the peripancreatic fat likely reflects underlying pancreatitis. No large peripancreatic fluid collection is noted to suggest pseudocyst at this time. 4. Small right and trace left pleural effusions with areas of increased signal intensity in the visualize lung bases which may reflect atelectasis and/or airspace consolidation. 5. Incompletely visualized mass in the pelvis. While this is favored to represent a fibroid uterus, this does warrant further evaluation with nonemergent pelvic ultrasound in the near future. 6. Additional  incidental findings, as above. Electronically Signed   By: Trudie Reed M.D.   On: 07/06/2015 07:55   Mr 3d Recon At Scanner  07/06/2015  CLINICAL DATA:  82 year old female with severe aortic stenosis presenting 2 times in the past 48 hours with epigastric pain common nausea and vomiting. Elevated lipase and elevated liver function tests. Abnormal gallbladder ultrasound demonstrating mass versus tumefactive sludge. EXAM: MRI ABDOMEN WITHOUT CONTRAST  (INCLUDING MRCP) TECHNIQUE: Multiplanar multisequence MR imaging of the abdomen was performed. Heavily T2-weighted images of the biliary and pancreatic ducts were obtained, and three-dimensional MRCP images were rendered by post processing. COMPARISON:  No prior abdominal MRI. Abdominal ultrasound 07/05/2015. FINDINGS: Comment: Study is severely limited by lack of IV gadolinium for detection of visceral and/or vascular abnormalities. Lower chest: Increased T2 signal intensity throughout the visualize right lower lobe, and to a lesser extent in portions of the left lower lobe. This is nonspecific, and may reflect underlying atelectasis and/or airspace consolidation. Small right and trace left pleural effusions. Cardiomegaly appear Hepatobiliary: No definite cystic or solid appearing hepatic lesions are identified on today's noncontrast examination. Diffuse periportal T2 hyperintensity, compatible with periportal edema. MRCP images demonstrate no intrahepatic biliary ductal dilatation. Common bile duct is mildly dilated measuring up to 9 mm in the porta hepatis. There appear to be some filling defects in the common bile duct, concerning for choledocholithiasis, largest of which measures approximately 6 mm (image 18 of series 8).  MRCP images demonstrate little to no intrahepatic biliary ductal dilatation at this time. There is irregular material filling much of the gallbladder. This is T1 isointense and T2 hypointense, but is incompletely evaluated without IV  gadolinium, and is favored to reflect a combination of tumefactive sludge and/or gallstones, however, underlying mass is difficult to entirely exclude. Notably, the gallbladder does not appear distended. Gallbladder [place] appears normal in thickness. No pericholecystic fluid. Pancreas: Pancreas appears morphologically normal. MRCP images demonstrate no pancreatic ductal dilatation. Trace amount of T2 hyperintensity in the peripancreatic fat may reflect inflammation. Node large peripancreatic fluid collection identified at this time. Spleen: Unremarkable. Adrenals/Urinary Tract: Bilateral kidneys and bilateral adrenal glands are normal in appearance. No hydroureteronephrosis in the visualized portions of the abdomen. Stomach/Bowel: Visualized portions are unremarkable. Vascular/Lymphatic: No aneurysm identified in the visualized abdominal vasculature. No lymphadenopathy noted in the abdomen. Other: Large mass in the lower abdomen and upper pelvis incompletely visualized, favored to represent a fibroid uterus. Although incompletely visualize, this mass measures up to 8.7 cm in diameter. No significant volume of ascites in the visualized peritoneal cavity. Musculoskeletal: No aggressive osseous lesions are identified in the visualized portions of the skeleton. IMPRESSION: 1. Study is limited by lack of IV gadolinium. With these limitations in mind, the material filling much of the gallbladder is favored to reflect a combination of tumefactive sludge and small gallstones. Given the relatively normal appearance of the gallbladder [place], gallbladder carcinoma is not strongly favored at this time. 2. There are some small filling defects in the common bile duct, compatible with choledocholithiasis, and the common bile duct is mildly dilated measuring 9 mm in the porta hepatis. This is associated with very mild intrahepatic biliary ductal dilatation. 3. Small amount of T2 hyperintensity in the peripancreatic fat likely  reflects underlying pancreatitis. No large peripancreatic fluid collection is noted to suggest pseudocyst at this time. 4. Small right and trace left pleural effusions with areas of increased signal intensity in the visualize lung bases which may reflect atelectasis and/or airspace consolidation. 5. Incompletely visualized mass in the pelvis. While this is favored to represent a fibroid uterus, this does warrant further evaluation with nonemergent pelvic ultrasound in the near future. 6. Additional incidental findings, as above. Electronically Signed   By: Trudie Reed M.D.   On: 07/06/2015 07:55   Dg Chest Port 1 View  07/05/2015  CLINICAL DATA:  Dyspnea EXAM: PORTABLE CHEST 1 VIEW COMPARISON:  02/24/2015 FINDINGS: Cardiomegaly again noted. Central vascular congestion without convincing pulmonary edema. Small right pleural effusion with right basilar atelectasis or infiltrate. Small left basilar atelectasis or infiltrate. Atherosclerotic calcifications of thoracic aorta. IMPRESSION: Central vascular congestion without convincing pulmonary edema. Small right pleural effusion with right basilar atelectasis or infiltrate. Small left basilar atelectasis or infiltrate. Electronically Signed   By: Natasha Mead M.D.   On: 07/05/2015 08:02    Scheduled Meds: . aspirin EC  81 mg Oral Daily  . carvedilol  3.125 mg Oral BID WC  . cefTRIAXone (ROCEPHIN)  IV  2 g Intravenous Q24H  . Chlorhexidine Gluconate Cloth  6 each Topical Q0600  . enoxaparin (LOVENOX) injection  30 mg Subcutaneous Q24H  . metronidazole  500 mg Intravenous Q8H  . mupirocin ointment  1 application Nasal BID  . pantoprazole  40 mg Oral Daily   Continuous Infusions: . sodium chloride 75 mL/hr at 07/06/15 0429    Time spent: 35 minutes  Damarri Rampy L  [facility] [email or web address], password [facility] 07/06/2015, 9:35 AM  LOS: 1 day

## 2015-07-07 ENCOUNTER — Encounter (HOSPITAL_COMMUNITY): Payer: Self-pay | Admitting: Cardiology

## 2015-07-07 DIAGNOSIS — K851 Biliary acute pancreatitis without necrosis or infection: Principal | ICD-10-CM

## 2015-07-07 DIAGNOSIS — I5033 Acute on chronic diastolic (congestive) heart failure: Secondary | ICD-10-CM

## 2015-07-07 DIAGNOSIS — I10 Essential (primary) hypertension: Secondary | ICD-10-CM

## 2015-07-07 DIAGNOSIS — I38 Endocarditis, valve unspecified: Secondary | ICD-10-CM

## 2015-07-07 DIAGNOSIS — E785 Hyperlipidemia, unspecified: Secondary | ICD-10-CM

## 2015-07-07 DIAGNOSIS — I350 Nonrheumatic aortic (valve) stenosis: Secondary | ICD-10-CM

## 2015-07-07 DIAGNOSIS — K8050 Calculus of bile duct without cholangitis or cholecystitis without obstruction: Secondary | ICD-10-CM

## 2015-07-07 DIAGNOSIS — K219 Gastro-esophageal reflux disease without esophagitis: Secondary | ICD-10-CM

## 2015-07-07 DIAGNOSIS — N179 Acute kidney failure, unspecified: Secondary | ICD-10-CM

## 2015-07-07 LAB — COMPREHENSIVE METABOLIC PANEL
ALBUMIN: 3.1 g/dL — AB (ref 3.5–5.0)
ALK PHOS: 188 U/L — AB (ref 38–126)
ALT: 248 U/L — AB (ref 14–54)
AST: 130 U/L — AB (ref 15–41)
Anion gap: 13 (ref 5–15)
BILIRUBIN TOTAL: 1.5 mg/dL — AB (ref 0.3–1.2)
BUN: 51 mg/dL — AB (ref 6–20)
CALCIUM: 9 mg/dL (ref 8.9–10.3)
CO2: 23 mmol/L (ref 22–32)
Chloride: 105 mmol/L (ref 101–111)
Creatinine, Ser: 1.57 mg/dL — ABNORMAL HIGH (ref 0.44–1.00)
GFR calc Af Amer: 32 mL/min — ABNORMAL LOW (ref >60)
GFR calc non Af Amer: 28 mL/min — ABNORMAL LOW (ref >60)
GLUCOSE: 76 mg/dL (ref 65–99)
Potassium: 4.3 mmol/L (ref 3.5–5.1)
Sodium: 141 mmol/L (ref 135–145)
TOTAL PROTEIN: 6.2 g/dL — AB (ref 6.5–8.1)

## 2015-07-07 LAB — CBC
HEMATOCRIT: 31 % — AB (ref 36.0–46.0)
HEMOGLOBIN: 9.7 g/dL — AB (ref 12.0–15.0)
MCH: 28.8 pg (ref 26.0–34.0)
MCHC: 31.3 g/dL (ref 30.0–36.0)
MCV: 92 fL (ref 78.0–100.0)
Platelets: 129 10*3/uL — ABNORMAL LOW (ref 150–400)
RBC: 3.37 MIL/uL — AB (ref 3.87–5.11)
RDW: 17.5 % — ABNORMAL HIGH (ref 11.5–15.5)
WBC: 10.4 10*3/uL (ref 4.0–10.5)

## 2015-07-07 LAB — LIPASE, BLOOD: Lipase: 497 U/L — ABNORMAL HIGH (ref 11–51)

## 2015-07-07 MED ORDER — ENOXAPARIN SODIUM 30 MG/0.3ML ~~LOC~~ SOLN
30.0000 mg | SUBCUTANEOUS | Status: DC
  Administered 2015-07-07 – 2015-07-10 (×4): 30 mg via SUBCUTANEOUS
  Filled 2015-07-07 (×4): qty 0.3

## 2015-07-07 NOTE — Progress Notes (Signed)
PATIENT DETAILS Name: Rebecca Woodard Age: 82 y.o. Sex: female Date of Birth: 10-Apr-1923 Admit Date: 07/05/2015 Admitting Physician Ozella Rocks, MD ZOX:[id number], MD  Subjective: Continues to have abdominal pain and vomiting overnight.  Assessment/Plan: Principal Problem: Acute gallstone pancreatitis: Continues to have vomiting and epigastric pain -however abdomen is soft and nontender disc morning. Continue nothing by mouth, start gentle IV fluid hydration. Await GI follow-up regarding ERCP.  Active Problems: Choledocholithiasis: Await GI follow-up regarding ERCP. Continue empiric antibiotics. Given no cholecystitis-suspect we can hold off on doing pursuing a laparoscopic cholecystectomy given severe aortic stenosis and other medical comorbidities.   Acute on chronic kidney disease stage III: Improving with IV fluids. Follow.  HTN (hypertension): controlled, without the need for any antihypertensives.   GERD (gastroesophageal reflux disease): Continue PPI  Dyslipidemia: Hold statin given elevated LFTs. Resume prior to discharge.  Severe aortic stenosis: Being followed by cardiology in the outpatient setting.  Chronic diastolic heart failure: Clinically compensated, watch closely while on IV fluids.  Disposition: Remain inpatient-transfer to MedSurg   Antimicrobial agents  See below  Anti-infectives    Start     Dose/Rate Route Frequency Ordered Stop   07/06/15 1000  cefTRIAXone (ROCEPHIN) 2 g in dextrose 5 % 50 mL IVPB    Comments:  Aware of PCN intolerance   2 g 100 mL/hr over 30 Minutes Intravenous Every 24 hours 07/06/15 0846     07/06/15 1000  metroNIDAZOLE (FLAGYL) IVPB 500 mg     500 mg 100 mL/hr over 60 Minutes Intravenous Every 8 hours 07/06/15 0846        DVT Prophylaxis: Prophylactic Lovenox  Code Status: Partial code  Family Communication 2 daughters at bedside  Procedures: None  CONSULTS:  GI  Time spent 35  minutes-Greater than 50% of this time was spent in counseling, explanation of diagnosis, planning of further management, and coordination of care.  MEDICATIONS: Scheduled Meds: . aspirin EC  81 mg Oral Daily  . cefTRIAXone (ROCEPHIN)  IV  2 g Intravenous Q24H  . Chlorhexidine Gluconate Cloth  6 each Topical Q0600  . metronidazole  500 mg Intravenous Q8H  . mupirocin ointment  1 application Nasal BID  . pantoprazole (PROTONIX) IV  40 mg Intravenous Q24H   Continuous Infusions: . sodium chloride 10 mL/hr at 07/06/15 1800   PRN Meds:.acetaminophen **OR** acetaminophen, bisacodyl, HYDROcodone-acetaminophen, HYDROmorphone (DILAUDID) injection, ondansetron, senna-docusate, traZODone    PHYSICAL EXAM: Vital signs in last 24 hours: Filed Vitals:   07/07/15 0400 07/07/15 0427 07/07/15 0750 07/07/15 0800  BP: 101/57  112/65 124/65  Pulse:   85 83  Temp: 98 F (36.7 C)  98.6 F (37 C) 98.5 F (36.9 C)  TempSrc: Oral  Oral Oral  Resp:   16 13  Height:      Weight:  78.7 kg (173 lb 8 oz)    SpO2: 98%  98% 98%    Weight change: 3.6 kg (7 lb 15 oz) Filed Weights   07/05/15 1800 07/06/15 0500 07/07/15 0427  Weight: 75.1 kg (165 lb 9.1 oz) 76.3 kg (168 lb 3.4 oz) 78.7 kg (173 lb 8 oz)   Body mass index is 27.17 kg/(m^2).   Gen Exam: Awake and alert with clear speech.   Neck: Supple, No JVD.   Chest: B/L Clear.   CVS: S1 S2 Regular,+ syst murmur.  Abdomen: soft, BS +, non tender, non distended.  Extremities:  no edema, lower extremities warm to touch. Neurologic: Non Focal.   Skin: No Rash.   Wounds: [name].   Intake/Output from previous day:  Intake/Output Summary (Last 24 hours) at 07/07/15 1027 Last data filed at 07/07/15 0000  Gross per 24 hour  Intake  158.5 ml  Output    525 ml  Net -366.5 ml     LAB RESULTS: CBC  Recent Labs Lab 07/03/15 2145 07/05/15 0350 07/06/15 0240 07/07/15 0245  WBC 9.2 13.5* 8.4 10.4  HGB 10.2* 10.7* 8.9* 9.7*  HCT 33.3* 35.5* 28.6*  31.0*  PLT 139* 154 111* 129*  MCV 89.3 89.9 90.5 92.0  MCH 27.3 27.1 28.2 28.8  MCHC 30.6 30.1 31.1 31.3  RDW 17.2* 17.1* 17.5* 17.5*    Chemistries   Recent Labs Lab 07/03/15 2145 07/05/15 0350 07/06/15 0240 07/07/15 0245  NA 136 136 138 141  K 5.1 4.5 4.7 4.3  CL 99* 98* 104 105  CO2 26 25 22 23   GLUCOSE 159* 113* 92 76  BUN 27* 36* 42* 51*  CREATININE 1.46* 1.76* 1.63* 1.57*  CALCIUM 8.8* 9.2 8.7* 9.0    CBG:  Recent Labs Lab 07/03/15 2200  GLUCAP 152*    GFR Estimated Creatinine Clearance: 25.7 mL/min (by C-G formula based on Cr of 1.57).  Coagulation profile No results for input(s): INR, PROTIME in the last 168 hours.  Cardiac Enzymes No results for input(s): CKMB, TROPONINI, MYOGLOBIN in the last 168 hours.  Invalid input(s): CK  Invalid input(s): POCBNP No results for input(s): DDIMER in the last 72 hours. No results for input(s): HGBA1C in the last 72 hours. No results for input(s): CHOL, HDL, LDLCALC, TRIG, CHOLHDL, LDLDIRECT in the last 72 hours. No results for input(s): TSH, T4TOTAL, T3FREE, THYROIDAB in the last 72 hours.  Invalid input(s): FREET3 No results for input(s): VITAMINB12, FOLATE, FERRITIN, TIBC, IRON, RETICCTPCT in the last 72 hours.  Recent Labs  07/06/15 0240 07/07/15 0245  LIPASE 1203* 497*    Urine Studies No results for input(s): UHGB, CRYS in the last 72 hours.  Invalid input(s): UACOL, UAPR, USPG, UPH, UTP, UGL, UKET, UBIL, UNIT, UROB, ULEU, UEPI, UWBC, URBC, UBAC, CAST, UCOM, BILUA  MICROBIOLOGY: Recent Results (from the past 240 hour(s))  MRSA PCR Screening     Status: Abnormal   Collection Time: 07/05/15  5:45 PM  Result Value Ref Range Status   MRSA by PCR POSITIVE (A) NEGATIVE Final    Comment:        The GeneXpert MRSA Assay (FDA approved for NASAL specimens only), is one component of a comprehensive MRSA colonization surveillance program. It is not intended to diagnose MRSA infection nor to guide  or monitor treatment for MRSA infections. RESULT CALLED TO, READ BACK BY AND VERIFIED WITH: Devoria AlbeM MASON RN [id number] 07/05/15 A BROWNING     RADIOLOGY STUDIES/RESULTS: [place] Abdomen Complete  07/05/2015  CLINICAL DATA:  Generalized abdominal pain, nausea vomiting since Sunday. Elevated lipase. EXAM: ABDOMEN ULTRASOUND COMPLETE COMPARISON:  Ultrasound 09/22/2009 FINDINGS: Gallbladder: Irregular lobular masslike lesion is centrally within the lumen of the gallbladder measures 6 cm x 2.7 cm by 3.0 cm. There is mild posterior shadowing associated this lobular masslike lesion. No significant vascular flow by color Doppler ultrasound. The gallbladder Matheney is thickened tube 7 mm. No pericholecystic fluid. Negative sonographic Murphy's sign. Common bile duct: Diameter: Common bile duct is distended to 8 to 10 mm. Liver: No biliary duct dilatation identified. No mass lesion. Normal echogenicity. IVC: No abnormality  visualized. Pancreas: Not well visualized Spleen: Size and appearance within normal limits. Right Kidney: Length: 9.8 cm. Echogenicity within normal limits. No mass or hydronephrosis visualized. Left Kidney: Length: 9.6 cm. Echogenicity within normal limits. No mass or hydronephrosis visualized. Abdominal aorta: No aneurysm visualized. Other findings: Bilateral small pleural effusions. IMPRESSION: 1. Lobular masslike lesion within the lumen of the gallbladder new from prior. Differential includes a tumefactive sludge versus gallbladder carcinoma. No vascularity within the mass suggesting favors tumefactive sludge. 2. Dilatation the common bile duct to 8 to 10 mm. 3. With this combination of gallbladder mass and common bile duct dilatation, recommend MRI / MRCP with contrast. If patient is not a good candidate for MRI (i.e. cannot hold breath for 15 to 20 seconds and follow voice commands) then recommend CT of the abdomen pelvis with contrast. Electronically Signed   By: Genevive Bi M.D.   On: 07/05/2015 09:55    Mr Abdomen Mrcp Wo Cm  07/06/2015  CLINICAL DATA:  82 year old female with severe aortic stenosis presenting 2 times in the past 48 hours with epigastric pain common nausea and vomiting. Elevated lipase and elevated liver function tests. Abnormal gallbladder ultrasound demonstrating mass versus tumefactive sludge. EXAM: MRI ABDOMEN WITHOUT CONTRAST  (INCLUDING MRCP) TECHNIQUE: Multiplanar multisequence MR imaging of the abdomen was performed. Heavily T2-weighted images of the biliary and pancreatic ducts were obtained, and three-dimensional MRCP images were rendered by post processing. COMPARISON:  No prior abdominal MRI. Abdominal ultrasound 07/05/2015. FINDINGS: Comment: Study is severely limited by lack of IV gadolinium for detection of visceral and/or vascular abnormalities. Lower chest: Increased T2 signal intensity throughout the visualize right lower lobe, and to a lesser extent in portions of the left lower lobe. This is nonspecific, and may reflect underlying atelectasis and/or airspace consolidation. Small right and trace left pleural effusions. Cardiomegaly appear Hepatobiliary: No definite cystic or solid appearing hepatic lesions are identified on today's noncontrast examination. Diffuse periportal T2 hyperintensity, compatible with periportal edema. MRCP images demonstrate no intrahepatic biliary ductal dilatation. Common bile duct is mildly dilated measuring up to 9 mm in the porta hepatis. There appear to be some filling defects in the common bile duct, concerning for choledocholithiasis, largest of which measures approximately 6 mm (image 18 of series 8). MRCP images demonstrate little to no intrahepatic biliary ductal dilatation at this time. There is irregular material filling much of the gallbladder. This is T1 isointense and T2 hypointense, but is incompletely evaluated without IV gadolinium, and is favored to reflect a combination of tumefactive sludge and/or gallstones, however, underlying  mass is difficult to entirely exclude. Notably, the gallbladder does not appear distended. Gallbladder [facility] appears normal in thickness. No pericholecystic fluid. Pancreas: Pancreas appears morphologically normal. MRCP images demonstrate no pancreatic ductal dilatation. Trace amount of T2 hyperintensity in the peripancreatic fat may reflect inflammation. Node large peripancreatic fluid collection identified at this time. Spleen: Unremarkable. Adrenals/Urinary Tract: Bilateral kidneys and bilateral adrenal glands are normal in appearance. No hydroureteronephrosis in the visualized portions of the abdomen. Stomach/Bowel: Visualized portions are unremarkable. Vascular/Lymphatic: No aneurysm identified in the visualized abdominal vasculature. No lymphadenopathy noted in the abdomen. Other: Large mass in the lower abdomen and upper pelvis incompletely visualized, favored to represent a fibroid uterus. Although incompletely visualize, this mass measures up to 8.7 cm in diameter. No significant volume of ascites in the visualized peritoneal cavity. Musculoskeletal: No aggressive osseous lesions are identified in the visualized portions of the skeleton. IMPRESSION: 1. Study is limited by lack of IV  gadolinium. With these limitations in mind, the material filling much of the gallbladder is favored to reflect a combination of tumefactive sludge and small gallstones. Given the relatively normal appearance of the gallbladder Chard, gallbladder carcinoma is not strongly favored at this time. 2. There are some small filling defects in the common bile duct, compatible with choledocholithiasis, and the common bile duct is mildly dilated measuring 9 mm in the porta hepatis. This is associated with very mild intrahepatic biliary ductal dilatation. 3. Small amount of T2 hyperintensity in the peripancreatic fat likely reflects underlying pancreatitis. No large peripancreatic fluid collection is noted to suggest pseudocyst at this time.  4. Small right and trace left pleural effusions with areas of increased signal intensity in the visualize lung bases which may reflect atelectasis and/or airspace consolidation. 5. Incompletely visualized mass in the pelvis. While this is favored to represent a fibroid uterus, this does warrant further evaluation with nonemergent pelvic ultrasound in the near future. 6. Additional incidental findings, as above. Electronically Signed   By: Trudie Reed M.D.   On: 07/06/2015 07:55   Mr 3d Recon At Scanner  07/06/2015  CLINICAL DATA:  82 year old female with severe aortic stenosis presenting 2 times in the past 48 hours with epigastric pain common nausea and vomiting. Elevated lipase and elevated liver function tests. Abnormal gallbladder ultrasound demonstrating mass versus tumefactive sludge. EXAM: MRI ABDOMEN WITHOUT CONTRAST  (INCLUDING MRCP) TECHNIQUE: Multiplanar multisequence MR imaging of the abdomen was performed. Heavily T2-weighted images of the biliary and pancreatic ducts were obtained, and three-dimensional MRCP images were rendered by post processing. COMPARISON:  No prior abdominal MRI. Abdominal ultrasound 07/05/2015. FINDINGS: Comment: Study is severely limited by lack of IV gadolinium for detection of visceral and/or vascular abnormalities. Lower chest: Increased T2 signal intensity throughout the visualize right lower lobe, and to a lesser extent in portions of the left lower lobe. This is nonspecific, and may reflect underlying atelectasis and/or airspace consolidation. Small right and trace left pleural effusions. Cardiomegaly appear Hepatobiliary: No definite cystic or solid appearing hepatic lesions are identified on today's noncontrast examination. Diffuse periportal T2 hyperintensity, compatible with periportal edema. MRCP images demonstrate no intrahepatic biliary ductal dilatation. Common bile duct is mildly dilated measuring up to 9 mm in the porta hepatis. There appear to be some  filling defects in the common bile duct, concerning for choledocholithiasis, largest of which measures approximately 6 mm (image 18 of series 8). MRCP images demonstrate little to no intrahepatic biliary ductal dilatation at this time. There is irregular material filling much of the gallbladder. This is T1 isointense and T2 hypointense, but is incompletely evaluated without IV gadolinium, and is favored to reflect a combination of tumefactive sludge and/or gallstones, however, underlying mass is difficult to entirely exclude. Notably, the gallbladder does not appear distended. Gallbladder [place] appears normal in thickness. No pericholecystic fluid. Pancreas: Pancreas appears morphologically normal. MRCP images demonstrate no pancreatic ductal dilatation. Trace amount of T2 hyperintensity in the peripancreatic fat may reflect inflammation. Node large peripancreatic fluid collection identified at this time. Spleen: Unremarkable. Adrenals/Urinary Tract: Bilateral kidneys and bilateral adrenal glands are normal in appearance. No hydroureteronephrosis in the visualized portions of the abdomen. Stomach/Bowel: Visualized portions are unremarkable. Vascular/Lymphatic: No aneurysm identified in the visualized abdominal vasculature. No lymphadenopathy noted in the abdomen. Other: Large mass in the lower abdomen and upper pelvis incompletely visualized, favored to represent a fibroid uterus. Although incompletely visualize, this mass measures up to 8.7 cm in diameter. No significant volume of  ascites in the visualized peritoneal cavity. Musculoskeletal: No aggressive osseous lesions are identified in the visualized portions of the skeleton. IMPRESSION: 1. Study is limited by lack of IV gadolinium. With these limitations in mind, the material filling much of the gallbladder is favored to reflect a combination of tumefactive sludge and small gallstones. Given the relatively normal appearance of the gallbladder Stoltz, gallbladder  carcinoma is not strongly favored at this time. 2. There are some small filling defects in the common bile duct, compatible with choledocholithiasis, and the common bile duct is mildly dilated measuring 9 mm in the porta hepatis. This is associated with very mild intrahepatic biliary ductal dilatation. 3. Small amount of T2 hyperintensity in the peripancreatic fat likely reflects underlying pancreatitis. No large peripancreatic fluid collection is noted to suggest pseudocyst at this time. 4. Small right and trace left pleural effusions with areas of increased signal intensity in the visualize lung bases which may reflect atelectasis and/or airspace consolidation. 5. Incompletely visualized mass in the pelvis. While this is favored to represent a fibroid uterus, this does warrant further evaluation with nonemergent pelvic ultrasound in the near future. 6. Additional incidental findings, as above. Electronically Signed   By: Trudie Reed M.D.   On: 07/06/2015 07:55   Dg Chest Port 1 View  07/05/2015  CLINICAL DATA:  Dyspnea EXAM: PORTABLE CHEST 1 VIEW COMPARISON:  02/24/2015 FINDINGS: Cardiomegaly again noted. Central vascular congestion without convincing pulmonary edema. Small right pleural effusion with right basilar atelectasis or infiltrate. Small left basilar atelectasis or infiltrate. Atherosclerotic calcifications of thoracic aorta. IMPRESSION: Central vascular congestion without convincing pulmonary edema. Small right pleural effusion with right basilar atelectasis or infiltrate. Small left basilar atelectasis or infiltrate. Electronically Signed   By: Natasha Mead M.D.   On: 07/05/2015 08:02    Jeoffrey Massed, MD  [facility] Pager:[phone]  If 7PM-7AM, please contact night-coverage [email or web address] Password [email or web address] 07/07/2015, 10:27 AM   LOS: 2 days

## 2015-07-07 NOTE — Progress Notes (Signed)
[facility] Progress Note  Subjective: Vomited once this morning complaining of some epigastric pain according to family members, she is forgetful and hard of hearing.  Objective: Vital signs in last 24 hours: Temp:  [97.8 F (36.6 C)-98.6 F (37 C)] 98.5 F (36.9 C) (03/08 0800) Pulse Rate:  [76-85] 83 (03/08 0800) Resp:  [13-21] 13 (03/08 0800) BP: (92-124)/(46-65) 124/65 mmHg (03/08 0800) SpO2:  [97 %-98 %] 98 % (03/08 0800) Weight:  [78.7 kg (173 lb 8 oz)] 78.7 kg (173 lb 8 oz) (03/08 0427) Weight change: 3.6 kg (7 lb 15 oz)   PE: Semi-alert oriented no acute distress abdomen soft  Lab Results: Results for orders placed or performed during the hospital encounter of 07/05/15 (from the past 24 hour(s))  CBC     Status: Abnormal   Collection Time: 07/07/15  2:45 AM  Result Value Ref Range   WBC 10.4 4.0 - 10.5 K/uL   RBC 3.37 (L) 3.87 - 5.11 MIL/uL   Hemoglobin 9.7 (L) 12.0 - 15.0 g/dL   HCT 16.1 (L) 09.6 - 04.5 %   MCV 92.0 78.0 - 100.0 fL   MCH 28.8 26.0 - 34.0 pg   MCHC 31.3 30.0 - 36.0 g/dL   RDW 40.9 (H) 81.1 - 91.4 %   Platelets 129 (L) 150 - 400 K/uL  Comprehensive metabolic panel     Status: Abnormal   Collection Time: 07/07/15  2:45 AM  Result Value Ref Range   Sodium 141 135 - 145 mmol/L   Potassium 4.3 3.5 - 5.1 mmol/L   Chloride 105 101 - 111 mmol/L   CO2 23 22 - 32 mmol/L   Glucose, Bld 76 65 - 99 mg/dL   BUN 51 (H) 6 - 20 mg/dL   Creatinine, Ser 7.82 (H) 0.44 - 1.00 mg/dL   Calcium 9.0 8.9 - 95.6 mg/dL   Total Protein 6.2 (L) 6.5 - 8.1 g/dL   Albumin 3.1 (L) 3.5 - 5.0 g/dL   AST 213 (H) 15 - 41 U/L   ALT 248 (H) 14 - 54 U/L   Alkaline Phosphatase 188 (H) 38 - 126 U/L   Total Bilirubin 1.5 (H) 0.3 - 1.2 mg/dL   GFR calc non Af Amer 28 (L) >60 mL/min   GFR calc Af Amer 32 (L) >60 mL/min   Anion gap 13 5 - 15  Lipase, blood     Status: Abnormal   Collection Time: 07/07/15  2:45 AM  Result Value Ref Range   Lipase 497 (H) 11 - 51 U/L     Studies/Results: Mr Abdomen Mrcp Wo Cm  07/06/2015  CLINICAL DATA:  82 year old female with severe aortic stenosis presenting 2 times in the past 48 hours with epigastric pain common nausea and vomiting. Elevated lipase and elevated liver function tests. Abnormal gallbladder ultrasound demonstrating mass versus tumefactive sludge. EXAM: MRI ABDOMEN WITHOUT CONTRAST  (INCLUDING MRCP) TECHNIQUE: Multiplanar multisequence MR imaging of the abdomen was performed. Heavily T2-weighted images of the biliary and pancreatic ducts were obtained, and three-dimensional MRCP images were rendered by post processing. COMPARISON:  No prior abdominal MRI. Abdominal ultrasound 07/05/2015. FINDINGS: Comment: Study is severely limited by lack of IV gadolinium for detection of visceral and/or vascular abnormalities. Lower chest: Increased T2 signal intensity throughout the visualize right lower lobe, and to a lesser extent in portions of the left lower lobe. This is nonspecific, and may reflect underlying atelectasis and/or airspace consolidation. Small right and trace left pleural effusions. Cardiomegaly appear Hepatobiliary: No  definite cystic or solid appearing hepatic lesions are identified on today's noncontrast examination. Diffuse periportal T2 hyperintensity, compatible with periportal edema. MRCP images demonstrate no intrahepatic biliary ductal dilatation. Common bile duct is mildly dilated measuring up to 9 mm in the porta hepatis. There appear to be some filling defects in the common bile duct, concerning for choledocholithiasis, largest of which measures approximately 6 mm (image 18 of series 8). MRCP images demonstrate little to no intrahepatic biliary ductal dilatation at this time. There is irregular material filling much of the gallbladder. This is T1 isointense and T2 hypointense, but is incompletely evaluated without IV gadolinium, and is favored to reflect a combination of tumefactive sludge and/or gallstones,  however, underlying mass is difficult to entirely exclude. Notably, the gallbladder does not appear distended. Gallbladder Shipes appears normal in thickness. No pericholecystic fluid. Pancreas: Pancreas appears morphologically normal. MRCP images demonstrate no pancreatic ductal dilatation. Trace amount of T2 hyperintensity in the peripancreatic fat may reflect inflammation. Node large peripancreatic fluid collection identified at this time. Spleen: Unremarkable. Adrenals/Urinary Tract: Bilateral kidneys and bilateral adrenal glands are normal in appearance. No hydroureteronephrosis in the visualized portions of the abdomen. Stomach/Bowel: Visualized portions are unremarkable. Vascular/Lymphatic: No aneurysm identified in the visualized abdominal vasculature. No lymphadenopathy noted in the abdomen. Other: Large mass in the lower abdomen and upper pelvis incompletely visualized, favored to represent a fibroid uterus. Although incompletely visualize, this mass measures up to 8.7 cm in diameter. No significant volume of ascites in the visualized peritoneal cavity. Musculoskeletal: No aggressive osseous lesions are identified in the visualized portions of the skeleton. IMPRESSION: 1. Study is limited by lack of IV gadolinium. With these limitations in mind, the material filling much of the gallbladder is favored to reflect a combination of tumefactive sludge and small gallstones. Given the relatively normal appearance of the gallbladder Moeckel, gallbladder carcinoma is not strongly favored at this time. 2. There are some small filling defects in the common bile duct, compatible with choledocholithiasis, and the common bile duct is mildly dilated measuring 9 mm in the porta hepatis. This is associated with very mild intrahepatic biliary ductal dilatation. 3. Small amount of T2 hyperintensity in the peripancreatic fat likely reflects underlying pancreatitis. No large peripancreatic fluid collection is noted to suggest  pseudocyst at this time. 4. Small right and trace left pleural effusions with areas of increased signal intensity in the visualize lung bases which may reflect atelectasis and/or airspace consolidation. 5. Incompletely visualized mass in the pelvis. While this is favored to represent a fibroid uterus, this does warrant further evaluation with nonemergent pelvic ultrasound in the near future. 6. Additional incidental findings, as above. Electronically Signed   By: Trudie Reed M.D.   On: 07/06/2015 07:55   Mr 3d Recon At Scanner  07/06/2015  CLINICAL DATA:  82 year old female with severe aortic stenosis presenting 2 times in the past 48 hours with epigastric pain common nausea and vomiting. Elevated lipase and elevated liver function tests. Abnormal gallbladder ultrasound demonstrating mass versus tumefactive sludge. EXAM: MRI ABDOMEN WITHOUT CONTRAST  (INCLUDING MRCP) TECHNIQUE: Multiplanar multisequence MR imaging of the abdomen was performed. Heavily T2-weighted images of the biliary and pancreatic ducts were obtained, and three-dimensional MRCP images were rendered by post processing. COMPARISON:  No prior abdominal MRI. Abdominal ultrasound 07/05/2015. FINDINGS: Comment: Study is severely limited by lack of IV gadolinium for detection of visceral and/or vascular abnormalities. Lower chest: Increased T2 signal intensity throughout the visualize right lower lobe, and to a lesser extent  in portions of the left lower lobe. This is nonspecific, and may reflect underlying atelectasis and/or airspace consolidation. Small right and trace left pleural effusions. Cardiomegaly appear Hepatobiliary: No definite cystic or solid appearing hepatic lesions are identified on today's noncontrast examination. Diffuse periportal T2 hyperintensity, compatible with periportal edema. MRCP images demonstrate no intrahepatic biliary ductal dilatation. Common bile duct is mildly dilated measuring up to 9 mm in the porta hepatis.  There appear to be some filling defects in the common bile duct, concerning for choledocholithiasis, largest of which measures approximately 6 mm (image 18 of series 8). MRCP images demonstrate little to no intrahepatic biliary ductal dilatation at this time. There is irregular material filling much of the gallbladder. This is T1 isointense and T2 hypointense, but is incompletely evaluated without IV gadolinium, and is favored to reflect a combination of tumefactive sludge and/or gallstones, however, underlying mass is difficult to entirely exclude. Notably, the gallbladder does not appear distended. Gallbladder Elicker appears normal in thickness. No pericholecystic fluid. Pancreas: Pancreas appears morphologically normal. MRCP images demonstrate no pancreatic ductal dilatation. Trace amount of T2 hyperintensity in the peripancreatic fat may reflect inflammation. Node large peripancreatic fluid collection identified at this time. Spleen: Unremarkable. Adrenals/Urinary Tract: Bilateral kidneys and bilateral adrenal glands are normal in appearance. No hydroureteronephrosis in the visualized portions of the abdomen. Stomach/Bowel: Visualized portions are unremarkable. Vascular/Lymphatic: No aneurysm identified in the visualized abdominal vasculature. No lymphadenopathy noted in the abdomen. Other: Large mass in the lower abdomen and upper pelvis incompletely visualized, favored to represent a fibroid uterus. Although incompletely visualize, this mass measures up to 8.7 cm in diameter. No significant volume of ascites in the visualized peritoneal cavity. Musculoskeletal: No aggressive osseous lesions are identified in the visualized portions of the skeleton. IMPRESSION: 1. Study is limited by lack of IV gadolinium. With these limitations in mind, the material filling much of the gallbladder is favored to reflect a combination of tumefactive sludge and small gallstones. Given the relatively normal appearance of the  gallbladder Higashi, gallbladder carcinoma is not strongly favored at this time. 2. There are some small filling defects in the common bile duct, compatible with choledocholithiasis, and the common bile duct is mildly dilated measuring 9 mm in the porta hepatis. This is associated with very mild intrahepatic biliary ductal dilatation. 3. Small amount of T2 hyperintensity in the peripancreatic fat likely reflects underlying pancreatitis. No large peripancreatic fluid collection is noted to suggest pseudocyst at this time. 4. Small right and trace left pleural effusions with areas of increased signal intensity in the visualize lung bases which may reflect atelectasis and/or airspace consolidation. 5. Incompletely visualized mass in the pelvis. While this is favored to represent a fibroid uterus, this does warrant further evaluation with nonemergent pelvic ultrasound in the near future. 6. Additional incidental findings, as above. Electronically Signed   By: Trudie Reedaniel  Entrikin M.D.   On: 07/06/2015 07:55      Assessment: Gallstone pancreatitis with sludge or stones in the gallbladder and common bile duct stones on MRCP Severe aortic stenosis with ejection fraction in the 20s  Plan: Long talk about ERCP risks and benefits with comorbidities of severe heart disease and advanced age. Overall the family is in favor of proceeding if cleared from a cardiac standpoint. We'll obtain cardiology consult and tentatively scheduled for 1 PM tomorrow.    Jaysion Ramseyer C 07/07/2015, 11:37 AM  Pager [phone] If no answer or after 5 PM call [phone]

## 2015-07-07 NOTE — Consult Note (Signed)
CARDIOLOGY CONSULT NOTE   Patient ID: Rebecca Woodard MRN: [id number] DOB/AGE: 1923/04/09 82 y.o.  Admit date: 07/05/2015  Primary Physician   Lupita Raider, MD Primary Cardiologist  Dr. [place] Reason for Consultation: Pre op for ERCP  HPI:  Rebecca Woodard is a 82 year old female with PMH of HTN, HLD, severe AS (AVA 0.43cm2, mean gradient 85mm Hg, peak gradient Hg), chronic mixed systolic and diastolic CHF. Last 2-D echo was in 12/2014, EF was 45-50%, there was hypokinesis of the basal-midanterolateral and inferolateral myocardium.    She was brought to the ED on 07/05/15 for nausea and abdominal pain.  She was also see in the ED on 07/04/15 for a syncopal episode and was d/c'd to home.  She had multiple episodes of vomiting and severe pain in her left upper quadrant of her abdomen.  She was diagnosed with pancreatitis.   GI has reccommended ERCP as ultrasound confirmed common bile duct stones.  MRCP shows no evidence of gallbladder neoplasm.  Cardiology was asked to evaluate risk of ERCP as the patient has history of severe AS.    She has never had a cardiac catheterization or suffered a MI according to her daughters. She was seen last month outpatient for follow up of her CHF and reported no dyspnea, denied weight gain, no orthopnea.  She takes  po Lasix at home daily.     Rebecca Woodard has some significant short term memory loss according to her daughters.  She is fairly active according to her daughters.  She planted dahlia bulbs last week in her garden and used a hoe to dig the holes.  She does use a motorized wheelchair in the grocery store, but is able to walk from the car to the door without significant SOB.  She also vacuums her house and does light house work.  Based on this she can preform 3 mets of activity with no SOB.     Past Medical History  Diagnosis Date  . Acid reflux   . Gallstones   . Heart murmur   . Inner ear disease   . HTN (hypertension) 01/09/2015  . GERD  (gastroesophageal reflux disease) 01/09/2015  . Hyperlipidemia 01/09/2015  . History of gallstones 01/09/2015  . Anemia 01/09/2015     Past Surgical History  Procedure Laterality Date  . Tonsilectomy, adenoidectomy, bilateral myringotomy and tubes Bilateral   . Appendectomy Bilateral     Allergies  Allergen Reactions  . Amoxicillin Other (See Comments)    Intolerance per son  . Salicylates Other (See Comments)    All nsaids - unknown reaction    I have reviewed the patient's current medications . aspirin EC  81 mg Oral Daily  . cefTRIAXone (ROCEPHIN)  IV  2 g Intravenous Q24H  . Chlorhexidine Gluconate Cloth  6 each Topical Q0600  . enoxaparin (LOVENOX) injection  30 mg Subcutaneous Q24H  . metronidazole  500 mg Intravenous Q8H  . mupirocin ointment  1 application Nasal BID  . pantoprazole (PROTONIX) IV  40 mg Intravenous Q24H   . sodium chloride 40 mL/hr at 07/07/15 1011   acetaminophen **OR** acetaminophen, bisacodyl, HYDROcodone-acetaminophen, HYDROmorphone (DILAUDID) injection, ondansetron, senna-docusate, traZODone  Prior to Admission medications   Medication Sig  aspirin EC 81 MG EC tablet Take 1 tablet (81 mg total) by mouth daily.  carvedilol (COREG) 3.125 MG tablet Take 1 tablet (3.125 mg total) by mouth 2 (two) times daily with a meal.  furosemide (LASIX) 40 MG  tablet TAKE 1 TABLET (40 MG TOTAL) BY MOUTH DAILY. Patient taking differently: TAKE 1 TABLET (40 MG TOTAL) BY MOUTH DAILY, MAY TAKE ANOTHER TABLET DURING THE DAY IF NEEDED FOR SWELLING/ SHORTNESS OF BREATH  KLOR-CON M20 20 MEQ tablet TAKE 2 TABLETS (40 MEQ TOTAL) BY MOUTH DAILY. TAKE WITH LASIX TO REPLACE POTASSIUM Patient taking differently: TAKE 1 TABLET BY MOUTH TWICE DAILY. TAKE WITH LASIX TO REPLACE POTASSIUM  omeprazole (PRILOSEC) 20 MG capsule Take 20 mg by mouth 2 (two) times daily.  OVER THE COUNTER MEDICATION Over the counter motion sickness pill - take 1 tablet by mouth every morning and may also take  1 or 2 tablets during the day as needed for nausea  simethicone (MYLICON) 80 MG chewable tablet Chew 80 mg by mouth every 6 (six) hours as needed for flatulence.  simvastatin (ZOCOR) 20 MG tablet Take 20 mg by mouth daily at 6 PM.     Social History   Social History  . Marital Status: Married    Spouse Name: [name]  . Number of Children: [name]  . Years of Education: [name]   Occupational History  . Retired    Social History Main Topics  . Smoking status: Never Smoker   . Smokeless tobacco: Not on file  . Alcohol Use: No  . Drug Use: No  . Sexual Activity: No   Other Topics Concern  . Not on file   Social History Narrative   Lives with daughter    Family Status  Relation Status Death Age  . Mother Deceased   . Father Deceased   . Maternal Grandmother Deceased   . Maternal Grandfather Deceased   . Paternal Grandmother Deceased   . Paternal Grandfather Deceased    Family History  Problem Relation Age of Onset  . Heart failure Mother   . Dementia Father   . Heart attack Father      ROS:  Full 14 point review of systems complete and found to be negative unless listed above.  Physical Exam: Blood pressure 119/58, pulse 84, temperature 98.3 F (36.8 C), temperature source Oral, resp. rate 15, height 5\' 7"  (1.702 m), weight 173 lb 8 oz (78.7 kg), SpO2 99 %.  General: Well developed, well nourished, elderly female in no acute distress Head: Eyes PERRLA, No xanthomas.   Normocephalic and atraumatic, oropharynx without edema or exudate. Dentition: dentures Lungs: Fine rales in LLL.  CTA in other fields.  Heart: HRRR S1 S2, no rub/gallop,4/6 systolic murmur througout precordium and radiates to neck. Pulses are 2+ extrem.   Neck: No carotid bruits. No lymphadenopathy.  4-5 JVD. Abdomen: Bowel sounds present, abdomen soft and non-tender without masses or hernias noted. Msk:  No spine or cva tenderness. No weakness, no joint deformities or effusions. Extremities: No clubbing or  cyanosis.  Generalized edema.  Neuro: Alert. Oriented to self.  No focal deficits noted. Psych:  Good affect, responds appropriately Skin: No rashes or lesions noted.  Labs:   Lab Results  Component Value Date   WBC 10.4 07/07/2015   HGB 9.7* 07/07/2015   HCT 31.0* 07/07/2015   MCV 92.0 07/07/2015   PLT 129* 07/07/2015    Recent Labs Lab 07/07/15 0245  NA 141  K 4.3  CL 105  CO2 23  BUN 51*  CREATININE 1.57*  CALCIUM 9.0  PROT 6.2*  BILITOT 1.5*  ALKPHOS 188*  ALT 248*  AST 130*  GLUCOSE 76  ALBUMIN 3.1*    Recent Labs  07/05/15  0408  TROPIPOC 0.04   LIPASE  Date/Time Value Ref Range Status  07/07/2015 02:45 AM 497* 11 - 51 U/L Final    Comment:    RESULTS CONFIRMED BY MANUAL DILUTION     Echo: 12/2014 - Left ventricle: The cavity size was normal. Weisberg thickness was  increased in a pattern of mild LVH. Systolic function was mildly  reduced. The estimated ejection fraction was in the range of 45%  to 50%. Hypokinesis of the basal-midanterolateral and  inferolateral myocardium. There was an increased relative  contribution of atrial contraction to ventricular filling.  Doppler parameters are consistent with elevated mean left atrial  filling pressure. - Aortic valve: There was severe stenosis. There was moderate to  severe regurgitation. Valve area (VTI): 0.43 cm^2. Valve area  (Vmax): 0.43 cm^2. Valve area (Vmean): 0.39 cm^2. - Mitral valve: Calcified annulus. Mildly thickened leaflets .  There was mild regurgitation. Diastolic regurgitation was  present, consistent with first degree AV block. - Left atrium: The atrium was mildly dilated. - Tricuspid valve: There was mild-moderate regurgitation directed  centrally. Diastolic regurgitation was present. - Pulmonary arteries: Systolic pressure was mildly increased. PA peak pressure 37 mm Hg.   ECG:  NSR  Radiology:  Mr Abdomen Mrcp Wo Cm  07/06/2015  CLINICAL DATA:  82 year old female with  severe aortic stenosis presenting 2 times in the past 48 hours with epigastric pain common nausea and vomiting. Elevated lipase and elevated liver function tests. Abnormal gallbladder ultrasound demonstrating mass versus tumefactive sludge. EXAM: MRI ABDOMEN WITHOUT CONTRAST  (INCLUDING MRCP) TECHNIQUE: Multiplanar multisequence MR imaging of the abdomen was performed. Heavily T2-weighted images of the biliary and pancreatic ducts were obtained, and three-dimensional MRCP images were rendered by post processing. COMPARISON:  No prior abdominal MRI. Abdominal ultrasound 07/05/2015. FINDINGS: Comment: Study is severely limited by lack of IV gadolinium for detection of visceral and/or vascular abnormalities. Lower chest: Increased T2 signal intensity throughout the visualize right lower lobe, and to a lesser extent in portions of the left lower lobe. This is nonspecific, and may reflect underlying atelectasis and/or airspace consolidation. Small right and trace left pleural effusions. Cardiomegaly appear Hepatobiliary: No definite cystic or solid appearing hepatic lesions are identified on today's noncontrast examination. Diffuse periportal T2 hyperintensity, compatible with periportal edema. MRCP images demonstrate no intrahepatic biliary ductal dilatation. Common bile duct is mildly dilated measuring up to 9 mm in the porta hepatis. There appear to be some filling defects in the common bile duct, concerning for choledocholithiasis, largest of which measures approximately 6 mm (image 18 of series 8). MRCP images demonstrate little to no intrahepatic biliary ductal dilatation at this time. There is irregular material filling much of the gallbladder. This is T1 isointense and T2 hypointense, but is incompletely evaluated without IV gadolinium, and is favored to reflect a combination of tumefactive sludge and/or gallstones, however, underlying mass is difficult to entirely exclude. Notably, the gallbladder does not  appear distended. Gallbladder Condon appears normal in thickness. No pericholecystic fluid. Pancreas: Pancreas appears morphologically normal. MRCP images demonstrate no pancreatic ductal dilatation. Trace amount of T2 hyperintensity in the peripancreatic fat may reflect inflammation. Node large peripancreatic fluid collection identified at this time. Spleen: Unremarkable. Adrenals/Urinary Tract: Bilateral kidneys and bilateral adrenal glands are normal in appearance. No hydroureteronephrosis in the visualized portions of the abdomen. Stomach/Bowel: Visualized portions are unremarkable. Vascular/Lymphatic: No aneurysm identified in the visualized abdominal vasculature. No lymphadenopathy noted in the abdomen. Other: Large mass in the lower abdomen and upper  pelvis incompletely visualized, favored to represent a fibroid uterus. Although incompletely visualize, this mass measures up to 8.7 cm in diameter. No significant volume of ascites in the visualized peritoneal cavity. Musculoskeletal: No aggressive osseous lesions are identified in the visualized portions of the skeleton. IMPRESSION: 1. Study is limited by lack of IV gadolinium. With these limitations in mind, the material filling much of the gallbladder is favored to reflect a combination of tumefactive sludge and small gallstones. Given the relatively normal appearance of the gallbladder [place], gallbladder carcinoma is not strongly favored at this time. 2. There are some small filling defects in the common bile duct, compatible with choledocholithiasis, and the common bile duct is mildly dilated measuring 9 mm in the porta hepatis. This is associated with very mild intrahepatic biliary ductal dilatation. 3. Small amount of T2 hyperintensity in the peripancreatic fat likely reflects underlying pancreatitis. No large peripancreatic fluid collection is noted to suggest pseudocyst at this time. 4. Small right and trace left pleural effusions with areas of increased  signal intensity in the visualize lung bases which may reflect atelectasis and/or airspace consolidation. 5. Incompletely visualized mass in the pelvis. While this is favored to represent a fibroid uterus, this does warrant further evaluation with nonemergent pelvic ultrasound in the near future. 6. Additional incidental findings, as above. Electronically Signed   By: Trudie Reed M.D.   On: 07/06/2015 07:55   Mr 3d Recon At Scanner  07/06/2015  CLINICAL DATA:  82 year old female with severe aortic stenosis presenting 2 times in the past 48 hours with epigastric pain common nausea and vomiting. Elevated lipase and elevated liver function tests. Abnormal gallbladder ultrasound demonstrating mass versus tumefactive sludge. EXAM: MRI ABDOMEN WITHOUT CONTRAST  (INCLUDING MRCP) TECHNIQUE: Multiplanar multisequence MR imaging of the abdomen was performed. Heavily T2-weighted images of the biliary and pancreatic ducts were obtained, and three-dimensional MRCP images were rendered by post processing. COMPARISON:  No prior abdominal MRI. Abdominal ultrasound 07/05/2015. FINDINGS: Comment: Study is severely limited by lack of IV gadolinium for detection of visceral and/or vascular abnormalities. Lower chest: Increased T2 signal intensity throughout the visualize right lower lobe, and to a lesser extent in portions of the left lower lobe. This is nonspecific, and may reflect underlying atelectasis and/or airspace consolidation. Small right and trace left pleural effusions. Cardiomegaly appear Hepatobiliary: No definite cystic or solid appearing hepatic lesions are identified on today's noncontrast examination. Diffuse periportal T2 hyperintensity, compatible with periportal edema. MRCP images demonstrate no intrahepatic biliary ductal dilatation. Common bile duct is mildly dilated measuring up to 9 mm in the porta hepatis. There appear to be some filling defects in the common bile duct, concerning for  choledocholithiasis, largest of which measures approximately 6 mm (image 18 of series 8). MRCP images demonstrate little to no intrahepatic biliary ductal dilatation at this time. There is irregular material filling much of the gallbladder. This is T1 isointense and T2 hypointense, but is incompletely evaluated without IV gadolinium, and is favored to reflect a combination of tumefactive sludge and/or gallstones, however, underlying mass is difficult to entirely exclude. Notably, the gallbladder does not appear distended. Gallbladder Bonneau appears normal in thickness. No pericholecystic fluid. Pancreas: Pancreas appears morphologically normal. MRCP images demonstrate no pancreatic ductal dilatation. Trace amount of T2 hyperintensity in the peripancreatic fat may reflect inflammation. Node large peripancreatic fluid collection identified at this time. Spleen: Unremarkable. Adrenals/Urinary Tract: Bilateral kidneys and bilateral adrenal glands are normal in appearance. No hydroureteronephrosis in the visualized portions of the abdomen.  Stomach/Bowel: Visualized portions are unremarkable. Vascular/Lymphatic: No aneurysm identified in the visualized abdominal vasculature. No lymphadenopathy noted in the abdomen. Other: Large mass in the lower abdomen and upper pelvis incompletely visualized, favored to represent a fibroid uterus. Although incompletely visualize, this mass measures up to 8.7 cm in diameter. No significant volume of ascites in the visualized peritoneal cavity. Musculoskeletal: No aggressive osseous lesions are identified in the visualized portions of the skeleton. IMPRESSION: 1. Study is limited by lack of IV gadolinium. With these limitations in mind, the material filling much of the gallbladder is favored to reflect a combination of tumefactive sludge and small gallstones. Given the relatively normal appearance of the gallbladder Barnard, gallbladder carcinoma is not strongly favored at this time. 2. There  are some small filling defects in the common bile duct, compatible with choledocholithiasis, and the common bile duct is mildly dilated measuring 9 mm in the porta hepatis. This is associated with very mild intrahepatic biliary ductal dilatation. 3. Small amount of T2 hyperintensity in the peripancreatic fat likely reflects underlying pancreatitis. No large peripancreatic fluid collection is noted to suggest pseudocyst at this time. 4. Small right and trace left pleural effusions with areas of increased signal intensity in the visualize lung bases which may reflect atelectasis and/or airspace consolidation. 5. Incompletely visualized mass in the pelvis. While this is favored to represent a fibroid uterus, this does warrant further evaluation with nonemergent pelvic ultrasound in the near future. 6. Additional incidental findings, as above. Electronically Signed   By: Trudie Reed M.D.   On: 07/06/2015 07:55    ASSESSMENT AND PLAN:    Principal Problem:   Acute gallstone pancreatitis Active Problems:   HTN (hypertension)   GERD (gastroesophageal reflux disease)   Hyperlipidemia   Aortic stenosis, severe   Acute on chronic diastolic heart failure due to valvular disease (HCC)   AKI (acute kidney injury) (HCC)   Choledocholithiasis  1. Evaluation of risk for ERCP   Based on the [facility] Criteria for derivation and prospective validation of simple risk index for prediction of cardiac risk of major noncardiac surgery, the patient has a moderate risk with 6.6% risk for complications. Based on the [facility] risk index, her risk is 9% for cardiac complications.   However, given her severe AS, ERCP would be considered very high risk.       Signed: Little Ishikawa, NP 07/07/2015 3:51 PM   Co-Sign MD  As above, patient seen and examined. Briefly she is a 82 year old female with past medical history of severe aortic stenosis, hypertension, hyperlipidemia, combined systolic/diastolic congestive heart  failure, renal insufficiency for preoperative evaluation prior to ERCP or cholecystectomy. Last echocardiogram in September 2016 showed ejection fraction 45-50%, severe aortic stenosis with mean gradient 85 mmHg, moderate to severe aortic insufficiency, Mild mitral regurgitation, mild to moderate tricuspid regurgitation. Patient admitted with acute gallstone pancreatitis. She has been seen by gastroenterology and possible ERCP or cholecystectomy may be needed. Cardiology asked to evaluate preoperatively. Electrocardiogram 07/03/2015 showed sinus rhythm, first-degree AV block, incomplete right bundle branch block, left ventricular hypertrophy and nonspecific ST changes. BUN 51 and creatinine 1.57.BNP 1600. 1 preoperative evaluation prior to ERCP or cholecystectomy-the patient is nearing 82 years of age. She has critical aortic stenosis with last mean gradient of 85 mmHg. She will be considered high risk for any procedure. I explained this to both she and her daughters. If procedures are felt indicated then she may proceed with the understanding that she is high risk including the  possibility of death. 2 critical aortic stenosis-the patient has been doing reasonably well from a symptomatic standpoint. She did have a syncopal episode this past Saturday in the setting of abdominal pain. However I do not feel like she is a good candidate for consideration of aortic valve replacement including TAVR. 3 acute on chronic stage III renal failure-given that she is nothing by mouth I agree with holding diuretics. Follow exam closely for volume excess. 4 pancreatitis/cholecystitis-management per primary care and gastroenterology. Olga Millers

## 2015-07-08 LAB — COMPREHENSIVE METABOLIC PANEL
ALK PHOS: 181 U/L — AB (ref 38–126)
ALT: 213 U/L — AB (ref 14–54)
AST: 125 U/L — AB (ref 15–41)
Albumin: 3.2 g/dL — ABNORMAL LOW (ref 3.5–5.0)
Anion gap: 7 (ref 5–15)
BUN: 47 mg/dL — AB (ref 6–20)
CALCIUM: 9 mg/dL (ref 8.9–10.3)
CHLORIDE: 107 mmol/L (ref 101–111)
CO2: 27 mmol/L (ref 22–32)
CREATININE: 1.49 mg/dL — AB (ref 0.44–1.00)
GFR calc Af Amer: 34 mL/min — ABNORMAL LOW (ref >60)
GFR calc non Af Amer: 30 mL/min — ABNORMAL LOW (ref >60)
GLUCOSE: 114 mg/dL — AB (ref 65–99)
Potassium: 4.5 mmol/L (ref 3.5–5.1)
SODIUM: 141 mmol/L (ref 135–145)
Total Bilirubin: 1 mg/dL (ref 0.3–1.2)
Total Protein: 6.6 g/dL (ref 6.5–8.1)

## 2015-07-08 LAB — PROTIME-INR
INR: 1.35 (ref 0.00–1.49)
PROTHROMBIN TIME: 16.8 s — AB (ref 11.6–15.2)

## 2015-07-08 LAB — LIPASE, BLOOD: Lipase: 172 U/L — ABNORMAL HIGH (ref 11–51)

## 2015-07-08 NOTE — Progress Notes (Signed)
Hospital Problem List     Principal Problem:   Acute gallstone pancreatitis Active Problems:   HTN (hypertension)   GERD (gastroesophageal reflux disease)   Hyperlipidemia   Aortic stenosis, severe   Acute on chronic diastolic heart failure due to valvular disease (HCC)   AKI (acute kidney injury) (HCC)   Choledocholithiasis    Patient Profile:   Primary Cardiologist: Dr. [place]  82 yo female w/ PMH of HTN, HLD, severe AS (AVA 0.43cm2, mean gradient 85mm Hg, peak gradient Hg), and chronic mixed systolic and diastolic CHF who presented to [facility] ED on 07/05/2015 for abdominal pain. Found to have acute gallstone pancreatitis. Cardiology consulted on 07/07/2015 for pre-operative clearance.   Subjective   Reports feeling better. Denies any abdominal pain. Still having mild nausea and vomiting.   Inpatient Medications    . aspirin EC  81 mg Oral Daily  . cefTRIAXone (ROCEPHIN)  IV  2 g Intravenous Q24H  . Chlorhexidine Gluconate Cloth  6 each Topical Q0600  . enoxaparin (LOVENOX) injection  30 mg Subcutaneous Q24H  . metronidazole  500 mg Intravenous Q8H  . mupirocin ointment  1 application Nasal BID  . pantoprazole (PROTONIX) IV  40 mg Intravenous Q24H    Vital Signs    Filed Vitals:   07/07/15 1610 07/07/15 2000 07/07/15 2039 07/08/15 0530  BP: 104/80 106/55 116/56 132/55  Pulse: 77  92 85  Temp: 97.6 F (36.4 C)  98.3 F (36.8 C) 98 F (36.7 C)  TempSrc: Oral  Oral   Resp: Height:    (1.575 m)   Weight:   159 lb 2.8 oz (72.2 kg)   SpO2: 100% 99% 98% 100%    Intake/Output Summary (Last 24 hours) at 07/08/15 1026 Last data filed at 07/08/15 0900  Gross per 24 hour  Intake 642.67 ml  Output    975 ml  Net -332.33 ml   Filed Weights   07/06/15 0500 07/07/15 0427 07/07/15 2039  Weight: 168 lb 3.4 oz (76.3 kg) 173 lb 8 oz (78.7 kg) 159 lb 2.8 oz (72.2 kg)    Physical Exam    General: Elderly Caucasian  female appearing in no  acute distress. Head: Normocephalic, atraumatic.  Neck: Supple without bruits, JVD not elevated. Lungs:  Resp regular and unlabored, rales at bases bilaterally. Heart: RRR, S1, S2, no S3, S4, 4/6 SEM throughout precordium and radiating into the neck; no rub. Abdomen: Soft, non-tender, non-distended with normoactive bowel sounds. No hepatomegaly. No rebound/guarding. No obvious abdominal masses. Extremities: No clubbing, cyanosis, or edema. Distal pedal pulses are 2+ bilaterally. Neuro: Alert and oriented X 3. Moves all extremities spontaneously. Psych: Normal affect.  Labs    CBC  Recent Labs  07/06/15 0240 07/07/15 0245  WBC 8.4 10.4  HGB 8.9* 9.7*  HCT 28.6* 31.0*  MCV 90.5 92.0  PLT 111* 129*   Basic Metabolic Panel  Recent Labs  07/07/15 0245 07/08/15 0810  NA 141 141  K 4.3 4.5  CL 105 107  CO2 23 27  GLUCOSE 76 114*  BUN 51* 47*  CREATININE 1.57* 1.49*  CALCIUM 9.0 9.0   Liver Function Tests  Recent Labs  07/07/15 0245 07/08/15 0810  AST 130* 125*  ALT 248* 213*  ALKPHOS 188* 181*  BILITOT 1.5* 1.0  PROT 6.2* 6.6  ALBUMIN 3.1* 3.2*    Recent Labs  07/07/15 0245 07/08/15 0810  LIPASE 497* 172*    Telemetry  Not connected to telemetry.  ECG    No new tracings.   Cardiac Studies and Radiology    US Abdomen Complete  07/05/2015  CLINICAL DATA:  Generalized abdominal pain, nausea vomiting since Sunday. Elevated lipase. EXAM: ABDOMEN ULTRASOUND COMPLETE COMPARISON:  Ultrasound 09/22/2009 FINDINGS: Gallbladder: Irregular lobular masslike lesion is centrally within the lumen of the gallbladder measures 6 cm x 2.7 cm by 3.0 cm. There is mild posterior shadowing associated this lobular masslike lesion. No significant vascular flow by color Doppler ultrasound. The gallbladder Sidener is thickened tube 7 mm. No pericholecystic fluid. Negative sonographic Murphy's sign. Common bile duct: Diameter: Common bile duct is distended to 8 to 10 mm. Liver: No  biliary duct dilatation identified. No mass lesion. Normal echogenicity. IVC: No abnormality visualized. Pancreas: Not well visualized Spleen: Size and appearance within normal limits. Right Kidney: Length: 9.8 cm. Echogenicity within normal limits. No mass or hydronephrosis visualized. Left Kidney: Length: 9.6 cm. Echogenicity within normal limits. No mass or hydronephrosis visualized. Abdominal aorta: No aneurysm visualized. Other findings: Bilateral small pleural effusions. IMPRESSION: 1. Lobular masslike lesion within the lumen of the gallbladder new from prior. Differential includes a tumefactive sludge versus gallbladder carcinoma. No vascularity within the mass suggesting favors tumefactive sludge. 2. Dilatation the common bile duct to 8 to 10 mm. 3. With this combination of gallbladder mass and common bile duct dilatation, recommend MRI / MRCP with contrast. If patient is not a good candidate for MRI (i.e. cannot hold breath for 15 to 20 seconds and follow voice commands) then recommend CT of the abdomen pelvis with contrast. Electronically Signed   By: Genevive Bi M.D.   On: 07/05/2015 09:55   Mr Abdomen Mrcp Wo Cm  07/06/2015  CLINICAL DATA:  82 year old female with severe aortic stenosis presenting 2 times in the past 48 hours with epigastric pain common nausea and vomiting. Elevated lipase and elevated liver function tests. Abnormal gallbladder ultrasound demonstrating mass versus tumefactive sludge. EXAM: MRI ABDOMEN WITHOUT CONTRAST  (INCLUDING MRCP) TECHNIQUE: Multiplanar multisequence MR imaging of the abdomen was performed. Heavily T2-weighted images of the biliary and pancreatic ducts were obtained, and three-dimensional MRCP images were rendered by post processing. COMPARISON:  No prior abdominal MRI. Abdominal ultrasound 07/05/2015. FINDINGS: Comment: Study is severely limited by lack of IV gadolinium for detection of visceral and/or vascular abnormalities. Lower chest: Increased T2 signal  intensity throughout the visualize right lower lobe, and to a lesser extent in portions of the left lower lobe. This is nonspecific, and may reflect underlying atelectasis and/or airspace consolidation. Small right and trace left pleural effusions. Cardiomegaly appear Hepatobiliary: No definite cystic or solid appearing hepatic lesions are identified on today's noncontrast examination. Diffuse periportal T2 hyperintensity, compatible with periportal edema. MRCP images demonstrate no intrahepatic biliary ductal dilatation. Common bile duct is mildly dilated measuring up to 9 mm in the porta hepatis. There appear to be some filling defects in the common bile duct, concerning for choledocholithiasis, largest of which measures approximately 6 mm (image 18 of series 8). MRCP images demonstrate little to no intrahepatic biliary ductal dilatation at this time. There is irregular material filling much of the gallbladder. This is T1 isointense and T2 hypointense, but is incompletely evaluated without IV gadolinium, and is favored to reflect a combination of tumefactive sludge and/or gallstones, however, underlying mass is difficult to entirely exclude. Notably, the gallbladder does not appear distended. Gallbladder [place] appears normal in thickness. No pericholecystic fluid. Pancreas: Pancreas appears morphologically normal. MRCP images demonstrate  no pancreatic ductal dilatation. Trace amount of T2 hyperintensity in the peripancreatic fat may reflect inflammation. Node large peripancreatic fluid collection identified at this time. Spleen: Unremarkable. Adrenals/Urinary Tract: Bilateral kidneys and bilateral adrenal glands are normal in appearance. No hydroureteronephrosis in the visualized portions of the abdomen. Stomach/Bowel: Visualized portions are unremarkable. Vascular/Lymphatic: No aneurysm identified in the visualized abdominal vasculature. No lymphadenopathy noted in the abdomen. Other: Large mass in the lower  abdomen and upper pelvis incompletely visualized, favored to represent a fibroid uterus. Although incompletely visualize, this mass measures up to 8.7 cm in diameter. No significant volume of ascites in the visualized peritoneal cavity. Musculoskeletal: No aggressive osseous lesions are identified in the visualized portions of the skeleton. IMPRESSION: 1. Study is limited by lack of IV gadolinium. With these limitations in mind, the material filling much of the gallbladder is favored to reflect a combination of tumefactive sludge and small gallstones. Given the relatively normal appearance of the gallbladder Kamel, gallbladder carcinoma is not strongly favored at this time. 2. There are some small filling defects in the common bile duct, compatible with choledocholithiasis, and the common bile duct is mildly dilated measuring 9 mm in the porta hepatis. This is associated with very mild intrahepatic biliary ductal dilatation. 3. Small amount of T2 hyperintensity in the peripancreatic fat likely reflects underlying pancreatitis. No large peripancreatic fluid collection is noted to suggest pseudocyst at this time. 4. Small right and trace left pleural effusions with areas of increased signal intensity in the visualize lung bases which may reflect atelectasis and/or airspace consolidation. 5. Incompletely visualized mass in the pelvis. While this is favored to represent a fibroid uterus, this does warrant further evaluation with nonemergent pelvic ultrasound in the near future. 6. Additional incidental findings, as above. Electronically Signed   By: Trudie Reed M.D.   On: 07/06/2015 07:55   Mr 3d Recon At Scanner  07/06/2015  CLINICAL DATA:  82 year old female with severe aortic stenosis presenting 2 times in the past 48 hours with epigastric pain common nausea and vomiting. Elevated lipase and elevated liver function tests. Abnormal gallbladder ultrasound demonstrating mass versus tumefactive sludge. EXAM: MRI  ABDOMEN WITHOUT CONTRAST  (INCLUDING MRCP) TECHNIQUE: Multiplanar multisequence MR imaging of the abdomen was performed. Heavily T2-weighted images of the biliary and pancreatic ducts were obtained, and three-dimensional MRCP images were rendered by post processing. COMPARISON:  No prior abdominal MRI. Abdominal ultrasound 07/05/2015. FINDINGS: Comment: Study is severely limited by lack of IV gadolinium for detection of visceral and/or vascular abnormalities. Lower chest: Increased T2 signal intensity throughout the visualize right lower lobe, and to a lesser extent in portions of the left lower lobe. This is nonspecific, and may reflect underlying atelectasis and/or airspace consolidation. Small right and trace left pleural effusions. Cardiomegaly appear Hepatobiliary: No definite cystic or solid appearing hepatic lesions are identified on today's noncontrast examination. Diffuse periportal T2 hyperintensity, compatible with periportal edema. MRCP images demonstrate no intrahepatic biliary ductal dilatation. Common bile duct is mildly dilated measuring up to 9 mm in the porta hepatis. There appear to be some filling defects in the common bile duct, concerning for choledocholithiasis, largest of which measures approximately 6 mm (image 18 of series 8). MRCP images demonstrate little to no intrahepatic biliary ductal dilatation at this time. There is irregular material filling much of the gallbladder. This is T1 isointense and T2 hypointense, but is incompletely evaluated without IV gadolinium, and is favored to reflect a combination of tumefactive sludge and/or gallstones, however, underlying mass  is difficult to entirely exclude. Notably, the gallbladder does not appear distended. Gallbladder [place] appears normal in thickness. No pericholecystic fluid. Pancreas: Pancreas appears morphologically normal. MRCP images demonstrate no pancreatic ductal dilatation. Trace amount of T2 hyperintensity in the peripancreatic  fat may reflect inflammation. Node large peripancreatic fluid collection identified at this time. Spleen: Unremarkable. Adrenals/Urinary Tract: Bilateral kidneys and bilateral adrenal glands are normal in appearance. No hydroureteronephrosis in the visualized portions of the abdomen. Stomach/Bowel: Visualized portions are unremarkable. Vascular/Lymphatic: No aneurysm identified in the visualized abdominal vasculature. No lymphadenopathy noted in the abdomen. Other: Large mass in the lower abdomen and upper pelvis incompletely visualized, favored to represent a fibroid uterus. Although incompletely visualize, this mass measures up to 8.7 cm in diameter. No significant volume of ascites in the visualized peritoneal cavity. Musculoskeletal: No aggressive osseous lesions are identified in the visualized portions of the skeleton. IMPRESSION: 1. Study is limited by lack of IV gadolinium. With these limitations in mind, the material filling much of the gallbladder is favored to reflect a combination of tumefactive sludge and small gallstones. Given the relatively normal appearance of the gallbladder Kampf, gallbladder carcinoma is not strongly favored at this time. 2. There are some small filling defects in the common bile duct, compatible with choledocholithiasis, and the common bile duct is mildly dilated measuring 9 mm in the porta hepatis. This is associated with very mild intrahepatic biliary ductal dilatation. 3. Small amount of T2 hyperintensity in the peripancreatic fat likely reflects underlying pancreatitis. No large peripancreatic fluid collection is noted to suggest pseudocyst at this time. 4. Small right and trace left pleural effusions with areas of increased signal intensity in the visualize lung bases which may reflect atelectasis and/or airspace consolidation. 5. Incompletely visualized mass in the pelvis. While this is favored to represent a fibroid uterus, this does warrant further evaluation with  nonemergent pelvic ultrasound in the near future. 6. Additional incidental findings, as above. Electronically Signed   By: Trudie Reed M.D.   On: 07/06/2015 07:55   Dg Chest Port 1 View  07/05/2015  CLINICAL DATA:  Dyspnea EXAM: PORTABLE CHEST 1 VIEW COMPARISON:  02/24/2015 FINDINGS: Cardiomegaly again noted. Central vascular congestion without convincing pulmonary edema. Small right pleural effusion with right basilar atelectasis or infiltrate. Small left basilar atelectasis or infiltrate. Atherosclerotic calcifications of thoracic aorta. IMPRESSION: Central vascular congestion without convincing pulmonary edema. Small right pleural effusion with right basilar atelectasis or infiltrate. Small left basilar atelectasis or infiltrate. Electronically Signed   By: Natasha Mead M.D.   On: 07/05/2015 08:02    Echocardiogram: 01/11/2015 Study Conclusions - Left ventricle: The cavity size was normal. Saggese thickness was  increased in a pattern of mild LVH. Systolic function was mildly  reduced. The estimated ejection fraction was in the range of 45%  to 50%. Hypokinesis of the basal-midanterolateral and  inferolateral myocardium. There was an increased relative  contribution of atrial contraction to ventricular filling.  Doppler parameters are consistent with elevated mean left atrial  filling pressure. - Aortic valve: There was severe stenosis. There was moderate to  severe regurgitation. Valve area (VTI): 0.43 cm^2. Valve area  (Vmax): 0.43 cm^2. Valve area (Vmean): 0.39 cm^2. - Mitral valve: Calcified annulus. Mildly thickened leaflets .  There was mild regurgitation. Diastolic regurgitation was  present, consistent with first degree AV block. - Left atrium: The atrium was mildly dilated. - Tricuspid valve: There was mild-moderate regurgitation directed  centrally. Diastolic regurgitation was present. - Pulmonary arteries: Systolic pressure  was mildly increased. PA  peak pressure:  37 mm Hg (S).  Assessment & Plan    1. Pre-operative Clearance for ERCP - due to her age and critical aortic stenosis, she would be considered high risk for any procedure. This has been explained to both the patient and her daughters.  2. Critical aortic stenosis - the patient has been doing reasonably well from a symptomatic standpoint. She did have a syncopal episode this past Saturday in the setting of abdominal pain. Not considered a good candidate for consideration of aortic valve replacement including TAVR. - will decrease her fluids to [facility] LPKVO, for she is starting to have rales in her lower lung fields. Will be consuming a soft liquid diet today, per discussion with the family.   3. Acute on chronic stage III renal failure - 1.76 on admission.  - improved to 1.49 on 07/08/2015.  4. Pancreatitis/cholecystitis - management per primary care and gastroenterology. - ERCP tentatively scheduled for tomorrow.   Signed, Ellsworth LennoxBrittany M Strader , PA-C 10:26 AM 07/08/2015 Pager: [phone] As above; patient seen and examined; denies dyspnea or CP; abdominal pain mildly improved; mild basilar crackles on exam; [facility]; as outlined previously, patient will be high risk for any procedure. Olga MillersBrian Keeley Sussman

## 2015-07-08 NOTE — Progress Notes (Signed)
PATIENT DETAILS Name: Rebecca Woodard Age: 82 y.o. Sex: female Date of Birth: 07/12/1922 Admit Date: 07/05/2015 Admitting Physician Ozella Rocksavid J Merrell, MD ZOX:WRUE,[id number]:SHAW,KIMBERLEE, MD  Subjective: Continues to have nausea-no vomiting or abd pain  Assessment/Plan: Principal Problem: Acute gallstone pancreatitis: Continues to have vomiting and epigastric pain -however abdomen is soft and nontender. Continue nothing by mouth, continue IV fluid hydration.Tentatively scheduled for ERCP if patient and family consent and accept risk-given severe Aortic stenosis.  Active Problems: Choledocholithiasis: Tentatively scheduled for ERCP if patient and family consent and accept risk-given severe Aortic stenosis. Given no cholecystitis-suspect we can hold off on doing pursuing a laparoscopic cholecystectomy given severe aortic stenosis and other medical comorbidities.   Acute on chronic kidney disease stage III: Improving with IV fluids. Follow.  HTN (hypertension): controlled, without the need for any antihypertensives.   GERD (gastroesophageal reflux disease): Continue PPI  Dyslipidemia: Hold statin given elevated LFTs. Resume prior to discharge.  Severe aortic stenosis: Cardiology consulted-high risk for ERCP/Cholecystectomy.Family/patient-currently in discussion regarding whether or not to proceed given high risk status  Chronic diastolic heart failure: Clinically compensated, watch closely while on IV fluids.  Disposition: Remain inpatient  Antimicrobial agents  See below  Anti-infectives    Start     Dose/Rate Route Frequency Ordered Stop   07/06/15 1000  cefTRIAXone (ROCEPHIN) 2 g in dextrose 5 % 50 mL IVPB    Comments:  Aware of PCN intolerance   2 g 100 mL/hr over 30 Minutes Intravenous Every 24 hours 07/06/15 0846     07/06/15 1000  metroNIDAZOLE (FLAGYL) IVPB 500 mg     500 mg 100 mL/hr over 60 Minutes Intravenous Every 8 hours 07/06/15 0846        DVT  Prophylaxis: Prophylactic Lovenox  Code Status: Partial code  Family Communication 2 daughters at bedside  Procedures: None  CONSULTS:  GI  Time spent 30 minutes-Greater than 50% of this time was spent in counseling, explanation of diagnosis, planning of further management, and coordination of care.  MEDICATIONS: Scheduled Meds: . aspirin EC  81 mg Oral Daily  . cefTRIAXone (ROCEPHIN)  IV  2 g Intravenous Q24H  . Chlorhexidine Gluconate Cloth  6 each Topical Q0600  . enoxaparin (LOVENOX) injection  30 mg Subcutaneous Q24H  . metronidazole  500 mg Intravenous Q8H  . mupirocin ointment  1 application Nasal BID  . pantoprazole (PROTONIX) IV  40 mg Intravenous Q24H   Continuous Infusions: . sodium chloride 10 mL/hr at 07/08/15 1057   PRN Meds:.acetaminophen **OR** acetaminophen, bisacodyl, HYDROcodone-acetaminophen, HYDROmorphone (DILAUDID) injection, ondansetron, senna-docusate, traZODone    PHYSICAL EXAM: Vital signs in last 24 hours: Filed Vitals:   07/07/15 1610 07/07/15 2000 07/07/15 2039 07/08/15 0530  BP: 104/80 106/55 116/56 132/55  Pulse: 77  92 85  Temp: 97.6 F (36.4 C)  98.3 F (36.8 C) 98 F (36.7 C)  TempSrc: Oral  Oral   Resp: 19 15 17 19   Height:   5\' 2"  (1.575 m)   Weight:   72.2 kg (159 lb 2.8 oz)   SpO2: 100% 99% 98% 100%    Weight change: -6.5 kg (-14 lb 5.3 oz) Filed Weights   07/06/15 0500 07/07/15 0427 07/07/15 2039  Weight: 76.3 kg (168 lb 3.4 oz) 78.7 kg (173 lb 8 oz) 72.2 kg (159 lb 2.8 oz)   Body mass index is 29.11 kg/(m^2).   Gen Exam: Awake and alert with  clear speech.   Neck: Supple, No JVD.   Chest: B/L Clear.   CVS: S1 S2 Regular,+ syst murmur.  Abdomen: soft, BS +, non tender, non distended.  Extremities: no edema, lower extremities warm to touch. Neurologic: Non Focal.   Skin: No Rash.   Wounds: [name].   Intake/Output from previous day:  Intake/Output Summary (Last 24 hours) at 07/08/15 1208 Last data filed at  07/08/15 0900  Gross per 24 hour  Intake    570 ml  Output   1075 ml  Net   -505 ml     LAB RESULTS: CBC  Recent Labs Lab 07/03/15 2145 07/05/15 0350 07/06/15 0240 07/07/15 0245  WBC 9.2 13.5* 8.4 10.4  HGB 10.2* 10.7* 8.9* 9.7*  HCT 33.3* 35.5* 28.6* 31.0*  PLT 139* 154 111* 129*  MCV 89.3 89.9 90.5 92.0  MCH 27.3 27.1 28.2 28.8  MCHC 30.6 30.1 31.1 31.3  RDW 17.2* 17.1* 17.5* 17.5*    Chemistries   Recent Labs Lab 07/03/15 2145 07/05/15 0350 07/06/15 0240 07/07/15 0245 07/08/15 0810  NA 136 136 138 141 141  K 5.1 4.5 4.7 4.3 4.5  CL 99* 98* 104 105 107  CO2 26 25 22 23 27   GLUCOSE 159* 113* 92 76 114*  BUN 27* 36* 42* 51* 47*  CREATININE 1.46* 1.76* 1.63* 1.57* 1.49*  CALCIUM 8.8* 9.2 8.7* 9.0 9.0    CBG:  Recent Labs Lab 07/03/15 2200  GLUCAP 152*    GFR Estimated Creatinine Clearance: 23.3 mL/min (by C-G formula based on Cr of 1.49).  Coagulation profile No results for input(s): INR, PROTIME in the last 168 hours.  Cardiac Enzymes No results for input(s): CKMB, TROPONINI, MYOGLOBIN in the last 168 hours.  Invalid input(s): CK  Invalid input(s): POCBNP No results for input(s): DDIMER in the last 72 hours. No results for input(s): HGBA1C in the last 72 hours. No results for input(s): CHOL, HDL, LDLCALC, TRIG, CHOLHDL, LDLDIRECT in the last 72 hours. No results for input(s): TSH, T4TOTAL, T3FREE, THYROIDAB in the last 72 hours.  Invalid input(s): FREET3 No results for input(s): VITAMINB12, FOLATE, FERRITIN, TIBC, IRON, RETICCTPCT in the last 72 hours.  Recent Labs  07/07/15 0245 07/08/15 0810  LIPASE 497* 172*    Urine Studies No results for input(s): UHGB, CRYS in the last 72 hours.  Invalid input(s): UACOL, UAPR, USPG, UPH, UTP, UGL, UKET, UBIL, UNIT, UROB, ULEU, UEPI, UWBC, URBC, UBAC, CAST, UCOM, BILUA  MICROBIOLOGY: Recent Results (from the past 240 hour(s))  MRSA PCR Screening     Status: Abnormal   Collection Time:  07/05/15  5:45 PM  Result Value Ref Range Status   MRSA by PCR POSITIVE (A) NEGATIVE Final    Comment:        The GeneXpert MRSA Assay (FDA approved for NASAL specimens only), is one component of a comprehensive MRSA colonization surveillance program. It is not intended to diagnose MRSA infection nor to guide or monitor treatment for MRSA infections. RESULT CALLED TO, READ BACK BY AND VERIFIED WITH: Devoria Albe RN 1610 07/05/15 A BROWNING     RADIOLOGY STUDIES/RESULTS: US Abdomen Complete  07/05/2015  CLINICAL DATA:  Generalized abdominal pain, nausea vomiting since Sunday. Elevated lipase. EXAM: ABDOMEN ULTRASOUND COMPLETE COMPARISON:  Ultrasound 09/22/2009 FINDINGS: Gallbladder: Irregular lobular masslike lesion is centrally within the lumen of the gallbladder measures 6 cm x 2.7 cm by 3.0 cm. There is mild posterior shadowing associated this lobular masslike lesion. No significant vascular flow by color  Doppler ultrasound. The gallbladder Janis is thickened tube 7 mm. No pericholecystic fluid. Negative sonographic Murphy's sign. Common bile duct: Diameter: Common bile duct is distended to 8 to 10 mm. Liver: No biliary duct dilatation identified. No mass lesion. Normal echogenicity. IVC: No abnormality visualized. Pancreas: Not well visualized Spleen: Size and appearance within normal limits. Right Kidney: Length: 9.8 cm. Echogenicity within normal limits. No mass or hydronephrosis visualized. Left Kidney: Length: 9.6 cm. Echogenicity within normal limits. No mass or hydronephrosis visualized. Abdominal aorta: No aneurysm visualized. Other findings: Bilateral small pleural effusions. IMPRESSION: 1. Lobular masslike lesion within the lumen of the gallbladder new from prior. Differential includes a tumefactive sludge versus gallbladder carcinoma. No vascularity within the mass suggesting favors tumefactive sludge. 2. Dilatation the common bile duct to 8 to 10 mm. 3. With this combination of gallbladder  mass and common bile duct dilatation, recommend MRI / MRCP with contrast. If patient is not a good candidate for MRI (i.e. cannot hold breath for 15 to 20 seconds and follow voice commands) then recommend CT of the abdomen pelvis with contrast. Electronically Signed   By: Genevive Bi M.D.   On: 07/05/2015 09:55   Mr Abdomen Mrcp Wo Cm  07/06/2015  CLINICAL DATA:  82 year old female with severe aortic stenosis presenting 2 times in the past 48 hours with epigastric pain common nausea and vomiting. Elevated lipase and elevated liver function tests. Abnormal gallbladder ultrasound demonstrating mass versus tumefactive sludge. EXAM: MRI ABDOMEN WITHOUT CONTRAST  (INCLUDING MRCP) TECHNIQUE: Multiplanar multisequence MR imaging of the abdomen was performed. Heavily T2-weighted images of the biliary and pancreatic ducts were obtained, and three-dimensional MRCP images were rendered by post processing. COMPARISON:  No prior abdominal MRI. Abdominal ultrasound 07/05/2015. FINDINGS: Comment: Study is severely limited by lack of IV gadolinium for detection of visceral and/or vascular abnormalities. Lower chest: Increased T2 signal intensity throughout the visualize right lower lobe, and to a lesser extent in portions of the left lower lobe. This is nonspecific, and may reflect underlying atelectasis and/or airspace consolidation. Small right and trace left pleural effusions. Cardiomegaly appear Hepatobiliary: No definite cystic or solid appearing hepatic lesions are identified on today's noncontrast examination. Diffuse periportal T2 hyperintensity, compatible with periportal edema. MRCP images demonstrate no intrahepatic biliary ductal dilatation. Common bile duct is mildly dilated measuring up to 9 mm in the porta hepatis. There appear to be some filling defects in the common bile duct, concerning for choledocholithiasis, largest of which measures approximately 6 mm (image 18 of series 8). MRCP images demonstrate  little to no intrahepatic biliary ductal dilatation at this time. There is irregular material filling much of the gallbladder. This is T1 isointense and T2 hypointense, but is incompletely evaluated without IV gadolinium, and is favored to reflect a combination of tumefactive sludge and/or gallstones, however, underlying mass is difficult to entirely exclude. Notably, the gallbladder does not appear distended. Gallbladder [place] appears normal in thickness. No pericholecystic fluid. Pancreas: Pancreas appears morphologically normal. MRCP images demonstrate no pancreatic ductal dilatation. Trace amount of T2 hyperintensity in the peripancreatic fat may reflect inflammation. Node large peripancreatic fluid collection identified at this time. Spleen: Unremarkable. Adrenals/Urinary Tract: Bilateral kidneys and bilateral adrenal glands are normal in appearance. No hydroureteronephrosis in the visualized portions of the abdomen. Stomach/Bowel: Visualized portions are unremarkable. Vascular/Lymphatic: No aneurysm identified in the visualized abdominal vasculature. No lymphadenopathy noted in the abdomen. Other: Large mass in the lower abdomen and upper pelvis incompletely visualized, favored to represent a fibroid uterus.  Although incompletely visualize, this mass measures up to 8.7 cm in diameter. No significant volume of ascites in the visualized peritoneal cavity. Musculoskeletal: No aggressive osseous lesions are identified in the visualized portions of the skeleton. IMPRESSION: 1. Study is limited by lack of IV gadolinium. With these limitations in mind, the material filling much of the gallbladder is favored to reflect a combination of tumefactive sludge and small gallstones. Given the relatively normal appearance of the gallbladder Hirota, gallbladder carcinoma is not strongly favored at this time. 2. There are some small filling defects in the common bile duct, compatible with choledocholithiasis, and the common bile  duct is mildly dilated measuring 9 mm in the porta hepatis. This is associated with very mild intrahepatic biliary ductal dilatation. 3. Small amount of T2 hyperintensity in the peripancreatic fat likely reflects underlying pancreatitis. No large peripancreatic fluid collection is noted to suggest pseudocyst at this time. 4. Small right and trace left pleural effusions with areas of increased signal intensity in the visualize lung bases which may reflect atelectasis and/or airspace consolidation. 5. Incompletely visualized mass in the pelvis. While this is favored to represent a fibroid uterus, this does warrant further evaluation with nonemergent pelvic ultrasound in the near future. 6. Additional incidental findings, as above. Electronically Signed   By: Trudie Reed M.D.   On: 07/06/2015 07:55   Mr 3d Recon At Scanner  07/06/2015  CLINICAL DATA:  82 year old female with severe aortic stenosis presenting 2 times in the past 48 hours with epigastric pain common nausea and vomiting. Elevated lipase and elevated liver function tests. Abnormal gallbladder ultrasound demonstrating mass versus tumefactive sludge. EXAM: MRI ABDOMEN WITHOUT CONTRAST  (INCLUDING MRCP) TECHNIQUE: Multiplanar multisequence MR imaging of the abdomen was performed. Heavily T2-weighted images of the biliary and pancreatic ducts were obtained, and three-dimensional MRCP images were rendered by post processing. COMPARISON:  No prior abdominal MRI. Abdominal ultrasound 07/05/2015. FINDINGS: Comment: Study is severely limited by lack of IV gadolinium for detection of visceral and/or vascular abnormalities. Lower chest: Increased T2 signal intensity throughout the visualize right lower lobe, and to a lesser extent in portions of the left lower lobe. This is nonspecific, and may reflect underlying atelectasis and/or airspace consolidation. Small right and trace left pleural effusions. Cardiomegaly appear Hepatobiliary: No definite cystic or  solid appearing hepatic lesions are identified on today's noncontrast examination. Diffuse periportal T2 hyperintensity, compatible with periportal edema. MRCP images demonstrate no intrahepatic biliary ductal dilatation. Common bile duct is mildly dilated measuring up to 9 mm in the porta hepatis. There appear to be some filling defects in the common bile duct, concerning for choledocholithiasis, largest of which measures approximately 6 mm (image 18 of series 8). MRCP images demonstrate little to no intrahepatic biliary ductal dilatation at this time. There is irregular material filling much of the gallbladder. This is T1 isointense and T2 hypointense, but is incompletely evaluated without IV gadolinium, and is favored to reflect a combination of tumefactive sludge and/or gallstones, however, underlying mass is difficult to entirely exclude. Notably, the gallbladder does not appear distended. Gallbladder [place] appears normal in thickness. No pericholecystic fluid. Pancreas: Pancreas appears morphologically normal. MRCP images demonstrate no pancreatic ductal dilatation. Trace amount of T2 hyperintensity in the peripancreatic fat may reflect inflammation. Node large peripancreatic fluid collection identified at this time. Spleen: Unremarkable. Adrenals/Urinary Tract: Bilateral kidneys and bilateral adrenal glands are normal in appearance. No hydroureteronephrosis in the visualized portions of the abdomen. Stomach/Bowel: Visualized portions are unremarkable. Vascular/Lymphatic: No aneurysm identified  in the visualized abdominal vasculature. No lymphadenopathy noted in the abdomen. Other: Large mass in the lower abdomen and upper pelvis incompletely visualized, favored to represent a fibroid uterus. Although incompletely visualize, this mass measures up to 8.7 cm in diameter. No significant volume of ascites in the visualized peritoneal cavity. Musculoskeletal: No aggressive osseous lesions are identified in the  visualized portions of the skeleton. IMPRESSION: 1. Study is limited by lack of IV gadolinium. With these limitations in mind, the material filling much of the gallbladder is favored to reflect a combination of tumefactive sludge and small gallstones. Given the relatively normal appearance of the gallbladder Pica, gallbladder carcinoma is not strongly favored at this time. 2. There are some small filling defects in the common bile duct, compatible with choledocholithiasis, and the common bile duct is mildly dilated measuring 9 mm in the porta hepatis. This is associated with very mild intrahepatic biliary ductal dilatation. 3. Small amount of T2 hyperintensity in the peripancreatic fat likely reflects underlying pancreatitis. No large peripancreatic fluid collection is noted to suggest pseudocyst at this time. 4. Small right and trace left pleural effusions with areas of increased signal intensity in the visualize lung bases which may reflect atelectasis and/or airspace consolidation. 5. Incompletely visualized mass in the pelvis. While this is favored to represent a fibroid uterus, this does warrant further evaluation with nonemergent pelvic ultrasound in the near future. 6. Additional incidental findings, as above. Electronically Signed   By: Trudie Reed M.D.   On: 07/06/2015 07:55   Dg Chest Port 1 View  07/05/2015  CLINICAL DATA:  Dyspnea EXAM: PORTABLE CHEST 1 VIEW COMPARISON:  02/24/2015 FINDINGS: Cardiomegaly again noted. Central vascular congestion without convincing pulmonary edema. Small right pleural effusion with right basilar atelectasis or infiltrate. Small left basilar atelectasis or infiltrate. Atherosclerotic calcifications of thoracic aorta. IMPRESSION: Central vascular congestion without convincing pulmonary edema. Small right pleural effusion with right basilar atelectasis or infiltrate. Small left basilar atelectasis or infiltrate. Electronically Signed   By: Natasha Mead M.D.   On:  07/05/2015 08:02    Jeoffrey Massed, MD  [facility] Pager:[phone]  If 7PM-7AM, please contact night-coverage [email or web address] Password [email or web address] 07/08/2015, 12:08 PM   LOS: 3 days

## 2015-07-08 NOTE — Progress Notes (Signed)
[facility] Progress Note  Subjective: Still having some postprandial pain and vomiting. Uncertain whether she wants to proceed with ERCP Objective: Vital signs in last 24 hours: Temp:  [97.6 F (36.4 C)-98.6 F (37 C)] 98 F (36.7 C) (03/09 0530) Pulse Rate:  [77-92] 85 (03/09 0530) Resp:  [13-19] 19 (03/09 0530) BP: (104-132)/(55-80) 132/55 mmHg (03/09 0530) SpO2:  [98 %-100 %] 100 % (03/09 0530) Weight:  [72.2 kg (159 lb 2.8 oz)] 72.2 kg (159 lb 2.8 oz) (03/08 2039) Weight change: -6.5 kg (-14 lb 5.3 oz)   PE: Unchanged  Lab Results: No results found for this or any previous visit (from the past 24 hour(s)).  Studies/Results: No results found.    Assessment: Gallstone pancreatitis Aortic stenosis Common bile duct stones on MRI  Plan: Very difficult decision regarding risk-benefit of ERCP in light of her advanced age and critical aortic stenosis. Another long talk with the family today. No definite decisions made. Procedure still on tentatively for 9:30 but will call daughter in the morning at 8:00 and make a definitive decision. Appreciate cardiology's input.    Caylor Tallarico C 07/08/2015, 7:12 AM  Pager [phone] If no answer or after 5 PM call [phone]

## 2015-07-09 ENCOUNTER — Inpatient Hospital Stay (HOSPITAL_COMMUNITY): Payer: Medicare Other | Admitting: Anesthesiology

## 2015-07-09 ENCOUNTER — Inpatient Hospital Stay (HOSPITAL_COMMUNITY): Payer: Medicare Other

## 2015-07-09 ENCOUNTER — Encounter (HOSPITAL_COMMUNITY)
Admission: EM | Disposition: A | Discharge status: Home / Self Care | Payer: Self-pay | Source: Home / Self Care | Attending: Internal Medicine

## 2015-07-09 HISTORY — PX: ERCP: SHX5425

## 2015-07-09 LAB — COMPREHENSIVE METABOLIC PANEL
ALBUMIN: 3.2 g/dL — AB (ref 3.5–5.0)
ALK PHOS: 179 U/L — AB (ref 38–126)
ALT: 157 U/L — AB (ref 14–54)
AST: 69 U/L — AB (ref 15–41)
Anion gap: 11 (ref 5–15)
BUN: 45 mg/dL — AB (ref 6–20)
CALCIUM: 8.8 mg/dL — AB (ref 8.9–10.3)
CHLORIDE: 107 mmol/L (ref 101–111)
CO2: 21 mmol/L — AB (ref 22–32)
CREATININE: 1.32 mg/dL — AB (ref 0.44–1.00)
GFR calc non Af Amer: 34 mL/min — ABNORMAL LOW (ref >60)
GFR, EST AFRICAN AMERICAN: 40 mL/min — AB (ref >60)
GLUCOSE: 125 mg/dL — AB (ref 65–99)
Potassium: 4.7 mmol/L (ref 3.5–5.1)
SODIUM: 139 mmol/L (ref 135–145)
Total Bilirubin: 1.3 mg/dL — ABNORMAL HIGH (ref 0.3–1.2)
Total Protein: 6.1 g/dL — ABNORMAL LOW (ref 6.5–8.1)

## 2015-07-09 LAB — LIPASE, BLOOD: Lipase: 601 U/L — ABNORMAL HIGH (ref 11–51)

## 2015-07-09 LAB — GLUCOSE, CAPILLARY: GLUCOSE-CAPILLARY: 136 mg/dL — AB (ref 65–99)

## 2015-07-09 SURGERY — ERCP, WITH INTERVENTION IF INDICATED
Anesthesia: General

## 2015-07-09 MED ORDER — SUCCINYLCHOLINE CHLORIDE 20 MG/ML IJ SOLN
INTRAMUSCULAR | Status: DC | PRN
  Administered 2015-07-09: 60 mg via INTRAVENOUS

## 2015-07-09 MED ORDER — ROCURONIUM BROMIDE 100 MG/10ML IV SOLN
INTRAVENOUS | Status: DC | PRN
  Administered 2015-07-09: 30 mg via INTRAVENOUS

## 2015-07-09 MED ORDER — NOREPINEPHRINE BITARTRATE 1 MG/ML IV SOLN: 4000.0000 ug | INTRAVENOUS | Status: DC | PRN

## 2015-07-09 MED ORDER — NOREPINEPHRINE BITARTRATE 1 MG/ML IV SOLN
0.0000 ug/min | INTRAVENOUS | Status: AC
  Administered 2015-07-09: 6 ug/min via INTRAVENOUS
  Filled 2015-07-09: qty 4

## 2015-07-09 MED ORDER — NEOSTIGMINE METHYLSULFATE 10 MG/10ML IV SOLN
INTRAVENOUS | Status: DC | PRN
  Administered 2015-07-09: 3 mg via INTRAVENOUS

## 2015-07-09 MED ORDER — SODIUM CHLORIDE 0.9 % IV SOLN
INTRAVENOUS | Status: DC | PRN
  Administered 2015-07-09: 80 mL

## 2015-07-09 MED ORDER — LIDOCAINE HCL (CARDIAC) 20 MG/ML IV SOLN
INTRAVENOUS | Status: DC | PRN
  Administered 2015-07-09: 40 mg via INTRAVENOUS

## 2015-07-09 MED ORDER — GLUCAGON HCL RDNA (DIAGNOSTIC) 1 MG IJ SOLR
INTRAMUSCULAR | Status: DC | PRN
  Administered 2015-07-09: 1 mg via INTRAVENOUS

## 2015-07-09 MED ORDER — GLUCAGON HCL RDNA (DIAGNOSTIC) 1 MG IJ SOLR
INTRAMUSCULAR | Status: AC
  Filled 2015-07-09: qty 1

## 2015-07-09 MED ORDER — SODIUM CHLORIDE 0.9 % IV SOLN
INTRAVENOUS | Status: DC
  Administered 2015-07-09: 1000 mL via INTRAVENOUS

## 2015-07-09 MED ORDER — GLYCOPYRROLATE 0.2 MG/ML IJ SOLN
INTRAMUSCULAR | Status: DC | PRN
  Administered 2015-07-09: 0.4 mg via INTRAVENOUS

## 2015-07-09 MED ORDER — FENTANYL CITRATE (PF) 100 MCG/2ML IJ SOLN
INTRAMUSCULAR | Status: DC | PRN
Start: 2015-07-09 — End: 2015-07-09
  Administered 2015-07-09: 50 ug via INTRAVENOUS

## 2015-07-09 MED ORDER — PROPOFOL 10 MG/ML IV BOLUS
INTRAVENOUS | Status: DC | PRN
  Administered 2015-07-09: 10 mg via INTRAVENOUS
  Administered 2015-07-09: 30 mg via INTRAVENOUS

## 2015-07-09 NOTE — Progress Notes (Signed)
[facility] PROGRESS NOTE  Rebecca Woodard ZOX:[id number] DOB: December 12, 1922 DOA: 07/05/2015 PCP: Lupita Raider, MD  Brief narrative: Rebecca Woodard is a 82 y.o. female with history of severe AS, combined systolic and diastolic heart failure, hypertension, was admitted for gallstone pancreatitis.  Subjective: Rebecca Woodard is alert and conversational. No abdominal pain but does endorse nausea. She was provided some soft foods yesterday which she did not vomit up. One episode of vomiting (phlegm) 5 hours after eating  and she had two small, soft BMs this morning.   Assessment/Plan:  Primary problem:  Acute gallstone pancreatitis - slowly improving, continues to have nausea, underwent ERCP today.  Given patient is a high-risk candidate for major surgical procedures-doubt we need to pursue a cholecystectomy at this time. I have spoken with family of multiple instances, we have agreed to monitor patient without pursuing a cholecystectomy. Advance diet as tolerated, monitor closely.    Active Problems: Choledocholithiasis: ECRP completed on  07/08/2013. See above regarding cholecystectomy.  Chronic diastolic heart failure: Clinically compensated; appears euvolemic at this time. Patient reports no SOB while in bed; some SOB when ambulating to the bedside commode.   HTN: Normotensive off medications.   GERD: Patient has no current complaints. Taking Protonix daily  Hyperlipidemia: Simvastatin held due to elevated LFTs. Restart statin on discharge and continue care in outpatient setting.  Aortic stenosis, severe, non-operative: Followed by cardiology in the outpatient setting.  AKI (acute kidney injury), CKD III: Improving with IV fluids. Patient continues to have no urinary problems during this hospital stay. Follow.  Code Status: Partial; No CPR or vent Family Communication: Daughters at bedside Disposition Plan: Remain inpatient. DVT Prophylaxis:  Lovenox   Consultants:  Gastroenterology  Cardiology  Procedures:  None  Antibiotics: Ceftriaxone 07/06/2015 >> Flagyl 07/06/2015 >>  Objective: Filed Vitals:   07/09/15 0640 07/09/15 0843  BP: 109/51 103/53  Pulse: 83 85  Temp: 98.7 F (37.1 C) 97.6 F (36.4 C)  Resp: 19 18    Intake/Output Summary (Last 24 hours) at 07/09/15 0907 Last data filed at 07/09/15 0800  Gross per 24 hour  Intake 1205.83 ml  Output    800 ml  Net 405.83 ml   Filed Weights   07/07/15 0427 07/07/15 2039 07/09/15 0640  Weight: 78.7 kg (173 lb 8 oz) 72.2 kg (159 lb 2.8 oz) 78.5 kg (173 lb 1 oz)    Exam:   General: Patient is semirecumbent in bed, in NAD. Pleasant, smiling. Follows commands appropriately.  Cardiovascular: S1, S2 normal. Severe AS that radiates to the carotids. No edema.  Respiratory: CTABL; no adventitious breath sounds.  Abdomen: Soft, ND, mild epigastric and upper right quadrant tenderness +BS  Musculoskeletal: Moves all limbs against gravity. Upper and lower extremity strength 4/5.  Neuro: No focal deficits  Data Reviewed: Basic Metabolic Panel:  Recent Labs Lab 07/05/15 0350 07/06/15 0240 07/07/15 0245 07/08/15 0810 07/09/15 0534  NA 136 138 141 141 139  K 4.5 4.7 4.3 4.5 4.7  CL 98* 104 105 107 107  CO2 21*  GLUCOSE 113* 92 76 114* 125*  BUN 36* 42* 51* 47* 45*  CREATININE 1.76* 1.63* 1.57* 1.49* 1.32*  CALCIUM 9.2 8.7* 9.0 9.0 8.8*   Liver Function Tests:  Recent Labs Lab 07/05/15 0350 07/06/15 0240 07/07/15 0245 07/08/15 0810 07/09/15 0534  AST 512* 255* 130* 125* 69*  ALT 453* 312* 248* 213* 157*  ALKPHOS 217* 178* 188* 181* 179*  BILITOT 4.4*  4.5* 4.0*  1.5* 1.0 1.3*  PROT 7.5 6.1* 6.2* 6.6 6.1*  ALBUMIN 4.0 3.1* 3.1* 3.2* 3.2*    Recent Labs Lab 07/05/15 0350 07/06/15 0240 07/07/15 0245 07/08/15 0810 07/09/15 0534  LIPASE 2693* 1203* 497* 172* 601*   No results for input(s): AMMONIA in the last 168  hours. CBC:  Recent Labs Lab 07/03/15 2145 07/05/15 0350 07/06/15 0240 07/07/15 0245  WBC 9.2 13.5* 8.4 10.4  HGB 10.2* 10.7* 8.9* 9.7*  HCT 33.3* 35.5* 28.6* 31.0*  MCV 89.3 89.9 90.5 92.0  PLT 139* 154 111* 129*   Cardiac Enzymes: No results for input(s): CKTOTAL, CKMB, CKMBINDEX, TROPONINI in the last 168 hours. BNP (last 3 results)  Recent Labs  01/09/15 1110 02/24/15 0800 07/03/15 2145  BNP 1937.2* 2269.9* 1600.5*    ProBNP (last 3 results) No results for input(s): PROBNP in the last 8760 hours.  CBG:  Recent Labs Lab 07/03/15 2200  GLUCAP 152*    Recent Results (from the past 240 hour(s))  MRSA PCR Screening     Status: Abnormal   Collection Time: 07/05/15  5:45 PM  Result Value Ref Range Status   MRSA by PCR POSITIVE (A) NEGATIVE Final    Comment:        The GeneXpert MRSA Assay (FDA approved for NASAL specimens only), is one component of a comprehensive MRSA colonization surveillance program. It is not intended to diagnose MRSA infection nor to guide or monitor treatment for MRSA infections. RESULT CALLED TO, READ BACK BY AND VERIFIED WITH: Rebecca AlbeM MASON RN [id number] 07/05/15 A BROWNING      Studies: No results found.  Scheduled Meds: . [MAR Hold] aspirin EC  81 mg Oral Daily  . [MAR Hold] cefTRIAXone (ROCEPHIN)  IV  2 g Intravenous Q24H  . [MAR Hold] Chlorhexidine Gluconate Cloth  6 each Topical Q0600  . [MAR Hold] enoxaparin (LOVENOX) injection  30 mg Subcutaneous Q24H  . [MAR Hold] metronidazole  500 mg Intravenous Q8H  . [MAR Hold] mupirocin ointment  1 application Nasal BID  . [MAR Hold] pantoprazole (PROTONIX) IV  40 mg Intravenous Q24H   Continuous Infusions: . sodium chloride 10 mL/hr at 07/08/15 1057  . sodium chloride 1,000 mL (07/09/15 [id number])    Principal Problem:   Acute gallstone pancreatitis Active Problems:   HTN (hypertension)   GERD (gastroesophageal reflux disease)   Hyperlipidemia   Aortic stenosis, severe   Acute on  chronic diastolic heart failure due to valvular disease (HCC)   AKI (acute kidney injury) (HCC)   Choledocholithiasis  Time spent 30 minutes-Greater than 50% of this time was spent in counseling, explanation of diagnosis, planning of further management, and coordination of care.   Rebecca SingleJamie Rose, PhD, PA-S  [facility]  If 7PM-7AM, please contact night-coverage at [email or web address], password [email or web address] 07/09/2015, 9:07 AM  LOS: 4 days   Attending MD note  Patient was seen, examined,treatment plan was discussed with the /PA-S.  I have personally reviewed the clinical findings, lab, imaging studies and management of this patient in detail. I agree with the documentation, as recorded by the PA-S.     Patient is is status post ERCP, which she seems to have tolerated well-and he needs to have some nausea, but no abdominal pain or vomiting. Advance diet slowly, belly is soft. I have spoken with family at multiple instances-we have agreed not to pursue cholecystectomy given the patient is a high risk for major surgical procedures. We will continue to follow and hopefully we can get out of  the hospital the next few days. PT evaluation.   [facility]

## 2015-07-09 NOTE — Anesthesia Preprocedure Evaluation (Signed)
Anesthesia Evaluation  Patient identified by MRN, date of birth, ID band Patient awake    Reviewed: Allergy & Precautions, NPO status , Patient's Chart, lab work & pertinent test results, reviewed documented beta blocker date and time   History of Anesthesia Complications Negative for: history of anesthetic complications  Airway Mallampati: II  TM Distance: >3 FB Neck ROM: Full    Dental  (+) Edentulous Upper, Teeth Intact   Pulmonary shortness of breath,    + rhonchi        Cardiovascular hypertension, Pt. on medications and Pt. on home beta blockers +CHF  + Valvular Problems/Murmurs AS  Rhythm:Regular + Systolic murmurs    Neuro/Psych negative neurological ROS  negative psych ROS   GI/Hepatic GERD  Medicated and Controlled,Gallstones pancreatitis   Endo/Other    Renal/GU Renal InsufficiencyRenal disease     Musculoskeletal   Abdominal   Peds  Hematology  (+) anemia ,   Anesthesia Other Findings   Reproductive/Obstetrics                             Anesthesia Physical Anesthesia Plan  ASA: IV  Anesthesia Plan: General   Post-op Pain Management:    Induction: Intravenous  Airway Management Planned: Oral ETT  Additional Equipment: Arterial line  Intra-op Plan:   Post-operative Plan: Extubation in OR and Possible Post-op intubation/ventilation  Informed Consent: I have reviewed the patients History and Physical, chart, labs and discussed the procedure including the risks, benefits and alternatives for the proposed anesthesia with the patient or authorized representative who has indicated his/her understanding and acceptance.   Dental advisory given  Plan Discussed with: CRNA and Surgeon  Anesthesia Plan Comments:         Anesthesia Quick Evaluation

## 2015-07-09 NOTE — Op Note (Signed)
[facility] Patient Name: Rebecca Woodard Procedure Date : 07/09/2015 MRN: [id number] Attending MD: Dorena Cookey ,  Date of Birth: 1922/05/05 CSN: [id number] Age: 82 Admit Type: Inpatient Procedure:                ERCP Indications:              Abnormal MRCP, Suspected bile duct stone(s) Providers:                Dorena Cookey, Dwain Sarna, RN, Arlee Muslim,                            Technician, Everardo All. Madilyn Fireman, MD Referring MD:              Medicines:                Monitored Anesthesia Care Complications:            No immediate complications. Estimated Blood Loss:     Estimated blood loss: none. Procedure:                Pre-Anesthesia Assessment:                           - Prior to the procedure, a History and Physical                            was performed, and patient medications and                            allergies were reviewed. The patient's tolerance of                            previous anesthesia was also reviewed. The risks                            and benefits of the procedure and the sedation                            options and risks were discussed with the patient.                            All questions were answered, and informed consent                            was obtained. Prior Anticoagulants: The patient has                            taken no previous anticoagulant or antiplatelet                            agents. ASA Grade Assessment: IV - A patient with                            severe systemic disease that is a constant threat  to life. After reviewing the risks and benefits,                            the patient was deemed in satisfactory condition to                            undergo the procedure.                           After obtaining informed consent, the scope was                            passed under direct vision. Throughout the                            procedure, the patient's blood  pressure, pulse, and                            oxygen saturations were monitored continuously. The                            [id number]) scope was introduced through                            the mouth, and used to inject contrast into and                            used to inject contrast into the bile duct and                            ventral pancreatic duct. The ERCP was somewhat                            difficult due to unusual anatomy. The patient                            tolerated the procedure well. Scope In: 10:26:56 AM Scope Out: 11:17:02 AM Total Procedure Duration: 0 hours 50 minutes 6 seconds  Findings:      The major papilla was located entirely within a diverticulum. The major       papilla was normal. The bile duct was deeply cannulated with the       sphincterotome. Contrast was injected. Opacification of the main bile       duct was successful. The maximum diameter of the ducts was 10 mm. The       lower third of the main bile duct contained one stone, which was 4 mm in       diameter. The entire opacified area was diffusely dilated, uncertain       significance. The biliary tree was otherwise normal. A 6 mm biliary       sphincterotomy was made with a sphincterotome using blended current.       There was no post-sphincterotomy bleeding. The biliary tree was swept       with a 15 mm balloon starting at the lower third of the main  duct. One       stone was removed. No stones remained. The total fluoroscopy exposure       time was 7 minutes and 10 seconds. Impression:               - The major papilla was located entirely within a                            diverticulum.                           - The major papilla appeared normal.                           - The biliary system was dilated, uncertain                            significance.                           - Choledocholithiasis was found. Complete removal                            was  accomplished by biliary sphincterotomy and                            balloon extraction.                           - A biliary sphincterotomy was performed.                           - The biliary tree was swept. Moderate Sedation:      no moderate sedation (see anesthesia note) Recommendation:           - Clear liquid diet today.                           - Check liver enzymes (AST, ALT, alkaline                            phosphatase, bilirubin) in the morning. Procedure Code(s):        --- Professional ---                           [id number], Endoscopic retrograde                            cholangiopancreatography (ERCP); with removal of                            calculi/debris from biliary/pancreatic duct(s)                           43262, Endoscopic retrograde                            cholangiopancreatography (ERCP); with  sphincterotomy/papillotomy Diagnosis Code(s):        --- Professional ---                           K80.50, Calculus of bile duct without cholangitis                            or cholecystitis without obstruction                           K83.8, Other specified diseases of biliary tract                           R93.2, Abnormal findings on diagnostic imaging of                            liver and biliary tract CPT copyright 2016 [employer]. All rights reserved. The codes documented in this report are preliminary and upon coder review may  be revised to meet current compliance requirements. Dorena Cookey,  07/09/2015 11:53:49 AM Dorena Cookey, MD Barrie Folk, MD 07/09/2015 11:53:17 AM Number of Addenda: 0

## 2015-07-09 NOTE — Anesthesia Postprocedure Evaluation (Signed)
Anesthesia Post Note  Patient: Rebecca Woodard  Procedure(s) Performed: Procedure(s) (LRB): ENDOSCOPIC RETROGRADE CHOLANGIOPANCREATOGRAPHY (ERCP) ([name])  Patient location during evaluation: PACU Anesthesia Type: General Level of consciousness: awake Pain management: pain level controlled Vital Signs Assessment: post-procedure vital signs reviewed and stable Cardiovascular status: stable Postop Assessment: no signs of nausea or vomiting Anesthetic complications: no    Last Vitals:  Filed Vitals:   07/09/15 1231 07/09/15 1300  BP: 122/58 134/53  Pulse: 89 94  Temp: 36.2 C 36.9 C  Resp: 27 22    Last Pain:  Filed Vitals:   07/09/15 1439  PainSc: 2                  Karmah Potocki

## 2015-07-09 NOTE — Progress Notes (Signed)
Pt a/o, HOH, had ERCP today, upon arrival back to unit pt c/o nausea, PRN Zofran given as ordered, pt resting in bed, family at bedside, VSS, pt stable

## 2015-07-09 NOTE — Transfer of Care (Signed)
Immediate Anesthesia Transfer of Care Note  Patient: Rebecca Woodard  Procedure(s) Performed: Procedure(s): ENDOSCOPIC RETROGRADE CHOLANGIOPANCREATOGRAPHY (ERCP) ([name])  Patient Location: PACU  Anesthesia Type:General  Level of Consciousness: awake, alert  and oriented  Airway & Oxygen Therapy: Patient Spontanous Breathing and Patient connected to nasal cannula oxygen  Post-op Assessment: Report given to RN and Post -op Vital signs reviewed and stable  Post vital signs: Reviewed and stable  Last Vitals:  Filed Vitals:   07/09/15 0640 07/09/15 0843  BP: 109/51 103/53  Pulse: 83 85  Temp: 37.1 C 36.4 C  Resp: 19 18    Complications: No apparent anesthesia complications

## 2015-07-09 NOTE — Anesthesia Procedure Notes (Signed)
Procedure Name: Intubation Date/Time: 07/09/2015 10:14 AM Performed by: Marena ChancyBECKNER, Frederica Chrestman S Pre-anesthesia Checklist: Emergency Drugs available, Timeout performed, Suction available, Patient identified and Patient being monitored Patient Re-evaluated:Patient Re-evaluated prior to inductionOxygen Delivery Method: Circle system utilized Preoxygenation: Pre-oxygenation with 100% oxygen Intubation Type: IV induction Ventilation: Mask ventilation without difficulty Laryngoscope Size: Miller and 2 Grade View: Grade I Tube type: Oral Tube size: 7.0 mm Number of attempts: 1 Placement Confirmation: ETT inserted through vocal cords under direct vision,  breath sounds checked- equal and bilateral and positive ETCO2 Tube secured with: Tape Dental Injury: Teeth and Oropharynx as per pre-operative assessment

## 2015-07-10 ENCOUNTER — Encounter (HOSPITAL_COMMUNITY): Payer: Self-pay | Admitting: Gastroenterology

## 2015-07-10 LAB — COMPREHENSIVE METABOLIC PANEL
ALK PHOS: 168 U/L — AB (ref 38–126)
ALT: 123 U/L — AB (ref 14–54)
ANION GAP: 10 (ref 5–15)
AST: 47 U/L — ABNORMAL HIGH (ref 15–41)
Albumin: 3.1 g/dL — ABNORMAL LOW (ref 3.5–5.0)
BUN: 37 mg/dL — ABNORMAL HIGH (ref 6–20)
CALCIUM: 8.8 mg/dL — AB (ref 8.9–10.3)
CO2: 22 mmol/L (ref 22–32)
CREATININE: 1.3 mg/dL — AB (ref 0.44–1.00)
Chloride: 108 mmol/L (ref 101–111)
GFR, EST AFRICAN AMERICAN: 41 mL/min — AB (ref >60)
GFR, EST NON AFRICAN AMERICAN: 35 mL/min — AB (ref >60)
Glucose, Bld: 103 mg/dL — ABNORMAL HIGH (ref 65–99)
Potassium: 4.8 mmol/L (ref 3.5–5.1)
SODIUM: 140 mmol/L (ref 135–145)
Total Bilirubin: 1.1 mg/dL (ref 0.3–1.2)
Total Protein: 5.8 g/dL — ABNORMAL LOW (ref 6.5–8.1)

## 2015-07-10 MED ORDER — FUROSEMIDE 10 MG/ML IJ SOLN
40.0000 mg | Freq: Once | INTRAMUSCULAR | Status: AC
  Administered 2015-07-10: 40 mg via INTRAVENOUS
  Filled 2015-07-10: qty 4

## 2015-07-10 MED ORDER — POTASSIUM CHLORIDE CRYS ER 20 MEQ PO TBCR
40.0000 meq | EXTENDED_RELEASE_TABLET | Freq: Once | ORAL | Status: AC
  Administered 2015-07-10: 40 meq via ORAL
  Filled 2015-07-10: qty 2

## 2015-07-10 MED ORDER — DIPHENHYDRAMINE HCL 25 MG PO CAPS
25.0000 mg | ORAL_CAPSULE | Freq: Once | ORAL | Status: AC
  Administered 2015-07-10: 25 mg via ORAL
  Filled 2015-07-10: qty 1

## 2015-07-10 NOTE — Progress Notes (Signed)
PATIENT DETAILS Name: Rebecca Woodard Age: 82 y.o. Sex: female Date of Birth: August 05, 1922 Admit Date: 07/05/2015 Admitting Physician Ozella Rocks, MD ZOX:[id number], MD  Subjective: Feels much better, main complaint is mild exertional dyspnea and generalized weakness from deconditioning.  Assessment/Plan: Principal Problem: Acute gallstone pancreatitis: Improved, underwent ERCP on 3/10. Advance diet, stop IV fluids. Suspect if continues to improve home tomorrow morning.   Active Problems: Choledocholithiasis:likely causing above, successfully underwent 3/10. Given no cholecystitis-suspect we can hold off on doing pursuing a laparoscopic cholecystectomy given severe aortic stenosis and other medical comorbidities.  family and patient agreeable with this plan.   Acute on chronic kidney disease stage III: Improvinwith supportive care.  HTN (hypertension): controlled, without the need for any antihypertensives.   GERD (gastroesophageal reflux disease): Continue PPI  Dyslipidemia: Hold statin given elevated LFTs. Resume prior to discharge.  Severe aortic stenosis: Cardiology was consulted for preoperative risk assessment- deemed to be a high-risk candidate for ERCP and cholecystectomy. After discussion with family, ERCP successfully treated on 3/10. Family has chosen not to pursue cholecystectomy at this time.  Chronic diastolic heart failure: Clinically compensated, stop IV fluids and resume Lasix.   Deconditioning: Continue PT-family refuses home health physical therapy.  Disposition: Remain inpatient-home 3/12   Antimicrobial agents  See below  Anti-infectives    Start     Dose/Rate Route Frequency Ordered Stop   07/06/15 1000  cefTRIAXone (ROCEPHIN) 2 g in dextrose 5 % 50 mL IVPB  Status:  Discontinued    Comments:  Aware of PCN intolerance   2 g 100 mL/hr over 30 Minutes Intravenous Every 24 hours 07/06/15 0846 07/10/15 1055   07/06/15 1000   metroNIDAZOLE (FLAGYL) IVPB 500 mg  Status:  Discontinued     500 mg 100 mL/hr over 60 Minutes Intravenous Every 8 hours 07/06/15 0846 07/10/15 1055      DVT Prophylaxis: Prophylactic Lovenox  Code Status: Partial code  Family Communication 2 daughters/Sons  at bedside  Procedures: None  CONSULTS:  GI  Time spent 30 minutes-Greater than 50% of this time was spent in counseling, explanation of diagnosis, planning of further management, and coordination of care.  MEDICATIONS: Scheduled Meds: . aspirin EC  81 mg Oral Daily  . enoxaparin (LOVENOX) injection  30 mg Subcutaneous Q24H  . furosemide  40 mg Intravenous Once  . mupirocin ointment  1 application Nasal BID  . pantoprazole (PROTONIX) IV  40 mg Intravenous Q24H  . potassium chloride  40 mEq Oral Once   Continuous Infusions: . sodium chloride 10 mL/hr at 07/08/15 1057   PRN Meds:.acetaminophen **OR** acetaminophen, bisacodyl, HYDROcodone-acetaminophen, HYDROmorphone (DILAUDID) injection, ondansetron, senna-docusate, traZODone    PHYSICAL EXAM: Vital signs in last 24 hours: Filed Vitals:   07/09/15 1752 07/09/15 2048 07/10/15 0441 07/10/15 1344  BP: 107/53 113/59 117/62 101/59  Pulse: 88 88 83 91  Temp: 98.7 F (37.1 C) 97.5 F (36.4 C) 98.2 F (36.8 C) 98.2 F (36.8 C)  TempSrc: Oral Oral Oral Oral  Resp: 18 19 19 18   Height:      Weight:   80.922 kg (178 lb 6.4 oz)   SpO2: 100% 100% 100% 93%    Weight change: 2.422 kg (5 lb 5.4 oz) Filed Weights   07/07/15 2039 07/09/15 0640 07/10/15 0441  Weight: 72.2 kg (159 lb 2.8 oz) 78.5 kg (173 lb 1 oz) 80.922 kg (178 lb 6.4 oz)  Body mass index is 32.62 kg/(m^2).   Gen Exam: Awake and alert with clear speech.   Neck: Supple, No JVD.   Chest: B/L Clear.   CVS: S1 S2 Regular,+ syst murmur.  Abdomen: soft, BS +, non tender, non distended.  Extremities: no edema, lower extremities warm to touch. Neurologic: Non Focal.   Skin: No Rash.   Wounds: [name].    Intake/Output from previous day:  Intake/Output Summary (Last 24 hours) at 07/10/15 1431 Last data filed at 07/10/15 1220  Gross per 24 hour  Intake 1265.67 ml  Output    850 ml  Net 415.67 ml     LAB RESULTS: CBC  Recent Labs Lab 07/03/15 2145 07/05/15 0350 07/06/15 0240 07/07/15 0245  WBC 9.2 13.5* 8.4 10.4  HGB 10.2* 10.7* 8.9* 9.7*  HCT 33.3* 35.5* 28.6* 31.0*  PLT 139* 154 111* 129*  MCV 89.3 89.9 90.5 92.0  MCH 27.3 27.1 28.2 28.8  MCHC 30.6 30.1 31.1 31.3  RDW 17.2* 17.1* 17.5* 17.5*    Chemistries   Recent Labs Lab 07/06/15 0240 07/07/15 0245 07/08/15 0810 07/09/15 0534 07/10/15 0649  NA 138 141 141 139 140  K 4.7 4.3 4.5 4.7 4.8  CL 104 105 107 107 108  CO2 21* 22  GLUCOSE 92 76 114* 125* 103*  BUN 42* 51* 47* 45* 37*  CREATININE 1.63* 1.57* 1.49* 1.32* 1.30*  CALCIUM 8.7* 9.0 9.0 8.8* 8.8*    CBG:  Recent Labs Lab 07/03/15 2200 07/09/15 1151  GLUCAP 152* 136*    GFR Estimated Creatinine Clearance: 28.3 mL/min (by C-G formula based on Cr of 1.3).  Coagulation profile  Recent Labs Lab 07/08/15 1226  INR 1.35    Cardiac Enzymes No results for input(s): CKMB, TROPONINI, MYOGLOBIN in the last 168 hours.  Invalid input(s): CK  Invalid input(s): POCBNP No results for input(s): DDIMER in the last 72 hours. No results for input(s): HGBA1C in the last 72 hours. No results for input(s): CHOL, HDL, LDLCALC, TRIG, CHOLHDL, LDLDIRECT in the last 72 hours. No results for input(s): TSH, T4TOTAL, T3FREE, THYROIDAB in the last 72 hours.  Invalid input(s): FREET3 No results for input(s): VITAMINB12, FOLATE, FERRITIN, TIBC, IRON, RETICCTPCT in the last 72 hours.  Recent Labs  07/08/15 0810 07/09/15 0534  LIPASE 172* 601*    Urine Studies No results for input(s): UHGB, CRYS in the last 72 hours.  Invalid input(s): UACOL, UAPR, USPG, UPH, UTP, UGL, UKET, UBIL, UNIT, UROB, ULEU, UEPI, UWBC, URBC, UBAC, CAST, UCOM,  BILUA  MICROBIOLOGY: Recent Results (from the past 240 hour(s))  MRSA PCR Screening     Status: Abnormal   Collection Time: 07/05/15  5:45 PM  Result Value Ref Range Status   MRSA by PCR POSITIVE (A) NEGATIVE Final    Comment:        The GeneXpert MRSA Assay (FDA approved for NASAL specimens only), is one component of a comprehensive MRSA colonization surveillance program. It is not intended to diagnose MRSA infection nor to guide or monitor treatment for MRSA infections. RESULT CALLED TO, READ BACK BY AND VERIFIED WITH: Devoria Albe RN [id number] 07/05/15 A BROWNING     RADIOLOGY STUDIES/RESULTS: US Abdomen Complete  07/05/2015  CLINICAL DATA:  Generalized abdominal pain, nausea vomiting since Sunday. Elevated lipase. EXAM: ABDOMEN ULTRASOUND COMPLETE COMPARISON:  Ultrasound 09/22/2009 FINDINGS: Gallbladder: Irregular lobular masslike lesion is centrally within the lumen of the gallbladder measures 6 cm x 2.7 cm by 3.0 cm. There is mild  posterior shadowing associated this lobular masslike lesion. No significant vascular flow by color Doppler ultrasound. The gallbladder List is thickened tube 7 mm. No pericholecystic fluid. Negative sonographic Murphy's sign. Common bile duct: Diameter: Common bile duct is distended to 8 to 10 mm. Liver: No biliary duct dilatation identified. No mass lesion. Normal echogenicity. IVC: No abnormality visualized. Pancreas: Not well visualized Spleen: Size and appearance within normal limits. Right Kidney: Length: 9.8 cm. Echogenicity within normal limits. No mass or hydronephrosis visualized. Left Kidney: Length: 9.6 cm. Echogenicity within normal limits. No mass or hydronephrosis visualized. Abdominal aorta: No aneurysm visualized. Other findings: Bilateral small pleural effusions. IMPRESSION: 1. Lobular masslike lesion within the lumen of the gallbladder new from prior. Differential includes a tumefactive sludge versus gallbladder carcinoma. No vascularity within the mass  suggesting favors tumefactive sludge. 2. Dilatation the common bile duct to 8 to 10 mm. 3. With this combination of gallbladder mass and common bile duct dilatation, recommend MRI / MRCP with contrast. If patient is not a good candidate for MRI (i.e. cannot hold breath for 15 to 20 seconds and follow voice commands) then recommend CT of the abdomen pelvis with contrast. Electronically Signed   By: Genevive Bi M.D.   On: 07/05/2015 09:55   Mr Abdomen Mrcp Wo Cm  07/06/2015  CLINICAL DATA:  82 year old female with severe aortic stenosis presenting 2 times in the past 48 hours with epigastric pain common nausea and vomiting. Elevated lipase and elevated liver function tests. Abnormal gallbladder ultrasound demonstrating mass versus tumefactive sludge. EXAM: MRI ABDOMEN WITHOUT CONTRAST  (INCLUDING MRCP) TECHNIQUE: Multiplanar multisequence MR imaging of the abdomen was performed. Heavily T2-weighted images of the biliary and pancreatic ducts were obtained, and three-dimensional MRCP images were rendered by post processing. COMPARISON:  No prior abdominal MRI. Abdominal ultrasound 07/05/2015. FINDINGS: Comment: Study is severely limited by lack of IV gadolinium for detection of visceral and/or vascular abnormalities. Lower chest: Increased T2 signal intensity throughout the visualize right lower lobe, and to a lesser extent in portions of the left lower lobe. This is nonspecific, and may reflect underlying atelectasis and/or airspace consolidation. Small right and trace left pleural effusions. Cardiomegaly appear Hepatobiliary: No definite cystic or solid appearing hepatic lesions are identified on today's noncontrast examination. Diffuse periportal T2 hyperintensity, compatible with periportal edema. MRCP images demonstrate no intrahepatic biliary ductal dilatation. Common bile duct is mildly dilated measuring up to 9 mm in the porta hepatis. There appear to be some filling defects in the common bile duct,  concerning for choledocholithiasis, largest of which measures approximately 6 mm (image 18 of series 8). MRCP images demonstrate little to no intrahepatic biliary ductal dilatation at this time. There is irregular material filling much of the gallbladder. This is T1 isointense and T2 hypointense, but is incompletely evaluated without IV gadolinium, and is favored to reflect a combination of tumefactive sludge and/or gallstones, however, underlying mass is difficult to entirely exclude. Notably, the gallbladder does not appear distended. Gallbladder Folden appears normal in thickness. No pericholecystic fluid. Pancreas: Pancreas appears morphologically normal. MRCP images demonstrate no pancreatic ductal dilatation. Trace amount of T2 hyperintensity in the peripancreatic fat may reflect inflammation. Node large peripancreatic fluid collection identified at this time. Spleen: Unremarkable. Adrenals/Urinary Tract: Bilateral kidneys and bilateral adrenal glands are normal in appearance. No hydroureteronephrosis in the visualized portions of the abdomen. Stomach/Bowel: Visualized portions are unremarkable. Vascular/Lymphatic: No aneurysm identified in the visualized abdominal vasculature. No lymphadenopathy noted in the abdomen. Other: Large mass in the  lower abdomen and upper pelvis incompletely visualized, favored to represent a fibroid uterus. Although incompletely visualize, this mass measures up to 8.7 cm in diameter. No significant volume of ascites in the visualized peritoneal cavity. Musculoskeletal: No aggressive osseous lesions are identified in the visualized portions of the skeleton. IMPRESSION: 1. Study is limited by lack of IV gadolinium. With these limitations in mind, the material filling much of the gallbladder is favored to reflect a combination of tumefactive sludge and small gallstones. Given the relatively normal appearance of the gallbladder [place]ier, gallbladder carcinoma is not strongly favored at this  time. 2. There are some small filling defects in the common bile duct, compatible with choledocholithiasis, and the common bile duct is mildly dilated measuring 9 mm in the porta hepatis. This is associated with very mild intrahepatic biliary ductal dilatation. 3. Small amount of T2 hyperintensity in the peripancreatic fat likely reflects underlying pancreatitis. No large peripancreatic fluid collection is noted to suggest pseudocyst at this time. 4. Small right and trace left pleural effusions with areas of increased signal intensity in the visualize lung bases which may reflect atelectasis and/or airspace consolidation. 5. Incompletely visualized mass in the pelvis. While this is favored to represent a fibroid uterus, this does warrant further evaluation with nonemergent pelvic ultrasound in the near future. 6. Additional incidental findings, as above. Electronically Signed   By: Trudie Reed M.D.   On: 07/06/2015 07:55   Mr 3d Recon At Scanner  07/06/2015  CLINICAL DATA:  82 year old female with severe aortic stenosis presenting 2 times in the past 48 hours with epigastric pain common nausea and vomiting. Elevated lipase and elevated liver function tests. Abnormal gallbladder ultrasound demonstrating mass versus tumefactive sludge. EXAM: MRI ABDOMEN WITHOUT CONTRAST  (INCLUDING MRCP) TECHNIQUE: Multiplanar multisequence MR imaging of the abdomen was performed. Heavily T2-weighted images of the biliary and pancreatic ducts were obtained, and three-dimensional MRCP images were rendered by post processing. COMPARISON:  No prior abdominal MRI. Abdominal ultrasound 07/05/2015. FINDINGS: Comment: Study is severely limited by lack of IV gadolinium for detection of visceral and/or vascular abnormalities. Lower chest: Increased T2 signal intensity throughout the visualize right lower lobe, and to a lesser extent in portions of the left lower lobe. This is nonspecific, and may reflect underlying atelectasis and/or  airspace consolidation. Small right and trace left pleural effusions. Cardiomegaly appear Hepatobiliary: No definite cystic or solid appearing hepatic lesions are identified on today's noncontrast examination. Diffuse periportal T2 hyperintensity, compatible with periportal edema. MRCP images demonstrate no intrahepatic biliary ductal dilatation. Common bile duct is mildly dilated measuring up to 9 mm in the porta hepatis. There appear to be some filling defects in the common bile duct, concerning for choledocholithiasis, largest of which measures approximately 6 mm (image 18 of series 8). MRCP images demonstrate little to no intrahepatic biliary ductal dilatation at this time. There is irregular material filling much of the gallbladder. This is T1 isointense and T2 hypointense, but is incompletely evaluated without IV gadolinium, and is favored to reflect a combination of tumefactive sludge and/or gallstones, however, underlying mass is difficult to entirely exclude. Notably, the gallbladder does not appear distended. Gallbladder Surette appears normal in thickness. No pericholecystic fluid. Pancreas: Pancreas appears morphologically normal. MRCP images demonstrate no pancreatic ductal dilatation. Trace amount of T2 hyperintensity in the peripancreatic fat may reflect inflammation. Node large peripancreatic fluid collection identified at this time. Spleen: Unremarkable. Adrenals/Urinary Tract: Bilateral kidneys and bilateral adrenal glands are normal in appearance. No hydroureteronephrosis in the visualized  portions of the abdomen. Stomach/Bowel: Visualized portions are unremarkable. Vascular/Lymphatic: No aneurysm identified in the visualized abdominal vasculature. No lymphadenopathy noted in the abdomen. Other: Large mass in the lower abdomen and upper pelvis incompletely visualized, favored to represent a fibroid uterus. Although incompletely visualize, this mass measures up to 8.7 cm in diameter. No significant  volume of ascites in the visualized peritoneal cavity. Musculoskeletal: No aggressive osseous lesions are identified in the visualized portions of the skeleton. IMPRESSION: 1. Study is limited by lack of IV gadolinium. With these limitations in mind, the material filling much of the gallbladder is favored to reflect a combination of tumefactive sludge and small gallstones. Given the relatively normal appearance of the gallbladder Mackie, gallbladder carcinoma is not strongly favored at this time. 2. There are some small filling defects in the common bile duct, compatible with choledocholithiasis, and the common bile duct is mildly dilated measuring 9 mm in the porta hepatis. This is associated with very mild intrahepatic biliary ductal dilatation. 3. Small amount of T2 hyperintensity in the peripancreatic fat likely reflects underlying pancreatitis. No large peripancreatic fluid collection is noted to suggest pseudocyst at this time. 4. Small right and trace left pleural effusions with areas of increased signal intensity in the visualize lung bases which may reflect atelectasis and/or airspace consolidation. 5. Incompletely visualized mass in the pelvis. While this is favored to represent a fibroid uterus, this does warrant further evaluation with nonemergent pelvic ultrasound in the near future. 6. Additional incidental findings, as above. Electronically Signed   By: Trudie Reedaniel  Entrikin M.D.   On: 07/06/2015 07:55   Dg Chest Port 1 View  07/05/2015  CLINICAL DATA:  Dyspnea EXAM: PORTABLE CHEST 1 VIEW COMPARISON:  02/24/2015 FINDINGS: Cardiomegaly again noted. Central vascular congestion without convincing pulmonary edema. Small right pleural effusion with right basilar atelectasis or infiltrate. Small left basilar atelectasis or infiltrate. Atherosclerotic calcifications of thoracic aorta. IMPRESSION: Central vascular congestion without convincing pulmonary edema. Small right pleural effusion with right basilar  atelectasis or infiltrate. Small left basilar atelectasis or infiltrate. Electronically Signed   By: Natasha MeadLiviu  Pop M.D.   On: 07/05/2015 08:02   Dg Ercp Biliary & Pancreatic Ducts  07/09/2015  CLINICAL DATA:  Biliary stones suspected on recent MRCP EXAM: ERCP TECHNIQUE: Multiple spot images obtained with the fluoroscopic device and submitted for interpretation post-procedure. FLUOROSCOPY TIME:  7 minutes 57 sec COMPARISON:  MRCP 07/05/2015 FINDINGS: A series of 6 fluoroscopic spot images document endoscopic cannulation and opacification of the CBD and central intrahepatic bile ducts. Subsequent images document passage of an endoscopic catheter through the duct. The central intrahepatic ducts appear decompressed. IMPRESSION: Endoscopic CBD catheter manipulation. These images were submitted for radiologic interpretation only. Please see the procedural report for the amount of contrast and the fluoroscopy time utilized. Electronically Signed   By: Corlis Leak  Hassell M.D.   On: 07/09/2015 13:28    Jeoffrey MassedGHIMIRE,SHANKER, MD  [facility] Pager:[phone]  If 7PM-7AM, please contact night-coverage [email or web address] Password [email or web address] 07/10/2015, 2:31 PM   LOS: 5 days

## 2015-07-10 NOTE — Evaluation (Signed)
Physical Therapy Evaluation Patient Details Name: Rebecca Woodard MRN: [id number] DOB: May 15, 1922 Today's Date: 07/10/2015   History of Present Illness  82 y.o. female with history of severe AS, combined systolic and diastolic heart failure, hypertension, was admitted for gallstone pancreatitis  Clinical Impression  Patient demonstrates deficits in functional mobility as indicated below. Will need continued skilled PT to address deficits and maximize function. Will see as indicated and progress tolerated. OF NOTE: Evaluation limited to OOB to chair secondary to dizziness and visible SOB. Required 3 liters [place] with increased WOB to bring O2 saturations to 93%. Unable to get reading via pulse oximeter initially. HR 100s.    Follow Up Recommendations Home health PT;Supervision/Assistance - 24 hour    Equipment Recommendations  None recommended by PT    Recommendations for Other Services       Precautions / Restrictions Precautions Precautions: Fall Precaution Comments: watch O2 saturations and HR Restrictions Weight Bearing Restrictions: No      Mobility  Bed Mobility Overal bed mobility: Needs Assistance Bed Mobility: Supine to Sit     Supine to sit: Min assist     General bed mobility comments: min assist to power to upright and scoot to EOB  Transfers Overall transfer level: Needs assistance Equipment used: Rolling walker (2 wheeled) Transfers: Sit to/from [employer] CorporationStand;Stand Pivot Transfers Sit to Stand: Min assist;+2 physical assistance (Manual assist to move hand on RW) Stand pivot transfers: Min assist       General transfer comment: + dizziness with upright, felt nauseated  Ambulation/Gait             General Gait Details: limited by nausea and dizziness  Stairs            Wheelchair Mobility    Modified Rankin (Stroke Patients Only)       Balance Overall balance assessment: Needs assistance   Sitting balance-Leahy Scale: Fair     Standing  balance support: During functional activity;Bilateral upper extremity supported Standing balance-Leahy Scale: Poor Standing balance comment: reliance on RW for support                             Pertinent Vitals/Pain Pain Assessment: No/denies pain    Home Living Family/patient expects to be discharged to:: Private residence Living Arrangements: Alone Available Help at Discharge: Family;Available 24 hours/day Type of Home: House Home Access: Ramped entrance     Home Layout: One level Home Equipment: Walker - 4 wheels      Prior Function Level of Independence: Independent with assistive device(s)         Comments: used a Psychiatristrolator walker      Hand Dominance   Dominant Hand: Right    Extremity/Trunk Assessment   Upper Extremity Assessment: Generalized weakness           Lower Extremity Assessment: Generalized weakness         Communication   Communication: HOH  Cognition Arousal/Alertness: Awake/alert Behavior During Therapy: Flat affect Overall Cognitive Status: Impaired/Different from baseline Area of Impairment: Following commands;Safety/judgement;Awareness       Following Commands: Follows one step commands with increased time Safety/Judgement: Decreased awareness of safety;Decreased awareness of deficits Awareness: Emergent        General Comments      Exercises        Assessment/Plan    PT Assessment Patient needs continued PT services  PT Diagnosis Difficulty walking;Abnormality of gait;Generalized weakness   PT Problem  List Decreased strength;Decreased activity tolerance;Decreased balance;Decreased mobility;Decreased safety awareness;Cardiopulmonary status limiting activity  PT Treatment Interventions DME instruction;Gait training;Functional mobility training;Therapeutic activities;Therapeutic exercise;Balance training;Patient/family education   PT Goals (Current goals can be found in the Care Plan section) Acute Rehab PT  Goals Patient Stated Goal: to go home PT Goal Formulation: With patient/family Time For Goal Achievement: 07/24/15 Potential to Achieve Goals: Good    Frequency Min 3X/week   Barriers to discharge        Co-evaluation               End of Session Equipment Utilized During Treatment: Gait belt;Oxygen Activity Tolerance: Patient limited by fatigue;Treatment limited secondary to medical complications (Comment) (SOB + dizziness (93% on 3 liters)) Patient left: in chair;with call bell/phone within reach;with family/visitor present Nurse Communication: Mobility status         Time: 6045-4098 PT Time Calculation (min) (ACUTE ONLY): 21 min   Charges:   PT Evaluation $PT Eval Moderate Complexity: 1 Procedure     PT G CodesFabio Asa 08-02-15, 8:33 AM Charlotte Crumb, PT DPT  [phone]

## 2015-07-10 NOTE — Progress Notes (Signed)
[facility] Progress Note  Subjective: No specific complaints today other than some nausea after her ERCP yesterday with biliary stones removed.  Objective: Vital signs in last 24 hours: Temp:  [97.1 F (36.2 C)-98.7 F (37.1 C)] 98.2 F (36.8 C) (03/11 0441) Pulse Rate:  [83-98] 83 (03/11 0441) Resp:  [14-27] 19 (03/11 0441) BP: (107-141)/(48-65) 117/62 mmHg (03/11 0441) SpO2:  [95 %-100 %] 100 % (03/11 0441) Arterial Line BP: (134-136)/(51-54) 134/51 mmHg (03/10 1216) Weight:  [80.922 kg (178 lb 6.4 oz)] 80.922 kg (178 lb 6.4 oz) (03/11 0441) Weight change: 2.422 kg (5 lb 5.4 oz)   PE:  No distress  Heart regular rhythm  Lungs clear  Abdomen soft and nontender  Lab Results: Results for orders placed or performed during the hospital encounter of 07/05/15 (from the past 24 hour(s))  Glucose, capillary     Status: Abnormal   Collection Time: 07/09/15 11:51 AM  Result Value Ref Range   Glucose-Capillary 136 (H) 65 - 99 mg/dL  Comprehensive metabolic panel     Status: Abnormal   Collection Time: 07/10/15  6:49 AM  Result Value Ref Range   Sodium 140 135 - 145 mmol/L   Potassium 4.8 3.5 - 5.1 mmol/L   Chloride 108 101 - 111 mmol/L   CO2 22 22 - 32 mmol/L   Glucose, Bld 103 (H) 65 - 99 mg/dL   BUN 37 (H) 6 - 20 mg/dL   Creatinine, Ser 1.611.30 (H) 0.44 - 1.00 mg/dL   Calcium 8.8 (L) 8.9 - 10.3 mg/dL   Total Protein 5.8 (L) 6.5 - 8.1 g/dL   Albumin 3.1 (L) 3.5 - 5.0 g/dL   AST 47 (H) 15 - 41 U/L   ALT 123 (H) 14 - 54 U/L   Alkaline Phosphatase 168 (H) 38 - 126 U/L   Total Bilirubin 1.1 0.3 - 1.2 mg/dL   GFR calc non Af Amer 35 (L) >60 mL/min   GFR calc Af Amer 41 (L) >60 mL/min   Anion gap 10 5 - 15    Studies/Results: Dg Ercp Biliary & Pancreatic Ducts  07/09/2015  CLINICAL DATA:  Biliary stones suspected on recent MRCP EXAM: ERCP TECHNIQUE: Multiple spot images obtained with the fluoroscopic device and submitted for interpretation post-procedure.  FLUOROSCOPY TIME:  7 minutes 57 sec COMPARISON:  MRCP 07/05/2015 FINDINGS: A series of 6 fluoroscopic spot images document endoscopic cannulation and opacification of the CBD and central intrahepatic bile ducts. Subsequent images document passage of an endoscopic catheter through the duct. The central intrahepatic ducts appear decompressed. IMPRESSION: Endoscopic CBD catheter manipulation. These images were submitted for radiologic interpretation only. Please see the procedural report for the amount of contrast and the fluoroscopy time utilized. Electronically Signed   By: Corlis Leak  Hassell M.D.   On: 07/09/2015 13:28      Assessment: Choledocholithiasis, status post ERCP with sphincterotomy and stone extraction  Plan:   Continue observation. Home soon if she does well. Advance diet as tolerated.    SAM F Denney Shein 07/10/2015, 11:08 AM  Pager: [phone] If no answer or after 5 PM call [phone]

## 2015-07-11 MED ORDER — PANTOPRAZOLE SODIUM 40 MG PO TBEC
40.0000 mg | DELAYED_RELEASE_TABLET | Freq: Every day | ORAL | Status: DC
  Administered 2015-07-11: 40 mg via ORAL
  Filled 2015-07-11: qty 1

## 2015-07-11 MED ORDER — ONDANSETRON 4 MG PO TBDP: 4.0000 mg | ORAL_TABLET | Freq: Three times a day (TID) | ORAL | Status: AC | PRN

## 2015-07-11 MED ORDER — OXYCODONE HCL 5 MG PO TABS: 5.0000 mg | ORAL_TABLET | Freq: Four times a day (QID) | ORAL | Status: AC | PRN

## 2015-07-11 NOTE — Discharge Summary (Signed)
PATIENT DETAILS Name: Rebecca Woodard Age: 82 y.o. Sex: female Date of Birth: [birth year] MRN: [id number]. Admitting Physician: Ozella Rocks, MD WUJ:WJXB,JYNWGNFAO, MD  Admit Date: 07/05/2015 Discharge date: 07/11/2015  Recommendations for Outpatient Follow-up:  1. Repeat Lytes/LFT's at next visit-if LFT's have normalized-resume statins  PRIMARY DISCHARGE DIAGNOSIS:  Principal Problem:   Acute gallstone pancreatitis Active Problems:   HTN (hypertension)   GERD (gastroesophageal reflux disease)   Hyperlipidemia   Aortic stenosis, severe   Acute on chronic diastolic heart failure due to valvular disease (HCC)   AKI (acute kidney injury) (HCC)   Choledocholithiasis      PAST MEDICAL HISTORY: Past Medical History  Diagnosis Date  . Acid reflux   . Gallstones   . Heart murmur   . Inner ear disease   . HTN (hypertension) 01/09/2015  . GERD (gastroesophageal reflux disease) 01/09/2015  . Hyperlipidemia 01/09/2015  . History of gallstones 01/09/2015  . Anemia 01/09/2015    DISCHARGE MEDICATIONS: Current Discharge Medication List    START taking these medications   Details  ondansetron (ZOFRAN ODT) 4 MG disintegrating tablet Take 1 tablet (4 mg total) by mouth every 8 (eight) hours as needed for nausea or vomiting. Qty: 20 tablet, Refills: 0    oxyCODONE (ROXICODONE) 5 MG immediate release tablet Take 1 tablet (5 mg total) by mouth every 6 (six) hours as needed for severe pain. Qty: 15 tablet, Refills: 0      CONTINUE these medications which have NOT CHANGED   Details  aspirin EC 81 MG EC tablet Take 1 tablet (81 mg total) by mouth daily. Qty: 30 tablet, Refills: 0    carvedilol (COREG) 3.125 MG tablet Take 1 tablet (3.125 mg total) by mouth 2 (two) times daily with a meal. Qty: 60 tablet, Refills: 0    furosemide (LASIX) 40 MG tablet TAKE 1 TABLET (40 MG TOTAL) BY MOUTH DAILY. Qty: 30 tablet, Refills: 11    KLOR-CON M20 20 MEQ tablet TAKE 2 TABLETS (40 MEQ TOTAL) BY  MOUTH DAILY. TAKE WITH LASIX TO REPLACE POTASSIUM Qty: 60 tablet, Refills: 11    omeprazole (PRILOSEC) 20 MG capsule Take 20 mg by mouth 2 (two) times daily. Refills: 11    OVER THE COUNTER MEDICATION Over the counter motion sickness pill - take 1 tablet by mouth every morning and may also take 1 or 2 tablets during the day as needed for nausea    simethicone (MYLICON) 80 MG chewable tablet Chew 80 mg by mouth every 6 (six) hours as needed for flatulence.      STOP taking these medications     simvastatin (ZOCOR) 20 MG tablet         ALLERGIES:   Allergies  Allergen Reactions  . Amoxicillin Other (See Comments)    Intolerance per son  . Salicylates Other (See Comments)    All nsaids - unknown reaction    BRIEF HPI:  See H&P, Labs, Consult and Test reports for all details in brief, patient  is a 82 y.o. female with a Past Medical History of GERD, HTN, HLD, severe AS, gallstones who presents with pancreatitis likely secondary to gallstones  CONSULTATIONS:   cardiology and GI  PERTINENT RADIOLOGIC STUDIES: US Abdomen Complete  07/05/2015  CLINICAL DATA:  Generalized abdominal pain, nausea vomiting since Sunday. Elevated lipase. EXAM: ABDOMEN ULTRASOUND COMPLETE COMPARISON:  Ultrasound 09/22/2009 FINDINGS: Gallbladder: Irregular lobular masslike lesion is centrally within the lumen of the gallbladder measures 6 cm x 2.7 cm by  3.0 cm. There is mild posterior shadowing associated this lobular masslike lesion. No significant vascular flow by color Doppler ultrasound. The gallbladder [place] is thickened tube 7 mm. No pericholecystic fluid. Negative sonographic Murphy's sign. Common bile duct: Diameter: Common bile duct is distended to 8 to 10 mm. Liver: No biliary duct dilatation identified. No mass lesion. Normal echogenicity. IVC: No abnormality visualized. Pancreas: Not well visualized Spleen: Size and appearance within normal limits. Right Kidney: Length: 9.8 cm. Echogenicity within  normal limits. No mass or hydronephrosis visualized. Left Kidney: Length: 9.6 cm. Echogenicity within normal limits. No mass or hydronephrosis visualized. Abdominal aorta: No aneurysm visualized. Other findings: Bilateral small pleural effusions. IMPRESSION: 1. Lobular masslike lesion within the lumen of the gallbladder new from prior. Differential includes a tumefactive sludge versus gallbladder carcinoma. No vascularity within the mass suggesting favors tumefactive sludge. 2. Dilatation the common bile duct to 8 to 10 mm. 3. With this combination of gallbladder mass and common bile duct dilatation, recommend MRI / MRCP with contrast. If patient is not a good candidate for MRI (i.e. cannot hold breath for 15 to 20 seconds and follow voice commands) then recommend CT of the abdomen pelvis with contrast. Electronically Signed   By: Genevive Bi M.D.   On: 07/05/2015 09:55   Mr Abdomen Mrcp Wo Cm  07/06/2015  CLINICAL DATA:  82 year old female with severe aortic stenosis presenting 2 times in the past 48 hours with epigastric pain common nausea and vomiting. Elevated lipase and elevated liver function tests. Abnormal gallbladder ultrasound demonstrating mass versus tumefactive sludge. EXAM: MRI ABDOMEN WITHOUT CONTRAST  (INCLUDING MRCP) TECHNIQUE: Multiplanar multisequence MR imaging of the abdomen was performed. Heavily T2-weighted images of the biliary and pancreatic ducts were obtained, and three-dimensional MRCP images were rendered by post processing. COMPARISON:  No prior abdominal MRI. Abdominal ultrasound 07/05/2015. FINDINGS: Comment: Study is severely limited by lack of IV gadolinium for detection of visceral and/or vascular abnormalities. Lower chest: Increased T2 signal intensity throughout the visualize right lower lobe, and to a lesser extent in portions of the left lower lobe. This is nonspecific, and may reflect underlying atelectasis and/or airspace consolidation. Small right and trace left  pleural effusions. Cardiomegaly appear Hepatobiliary: No definite cystic or solid appearing hepatic lesions are identified on today's noncontrast examination. Diffuse periportal T2 hyperintensity, compatible with periportal edema. MRCP images demonstrate no intrahepatic biliary ductal dilatation. Common bile duct is mildly dilated measuring up to 9 mm in the porta hepatis. There appear to be some filling defects in the common bile duct, concerning for choledocholithiasis, largest of which measures approximately 6 mm (image 18 of series 8). MRCP images demonstrate little to no intrahepatic biliary ductal dilatation at this time. There is irregular material filling much of the gallbladder. This is T1 isointense and T2 hypointense, but is incompletely evaluated without IV gadolinium, and is favored to reflect a combination of tumefactive sludge and/or gallstones, however, underlying mass is difficult to entirely exclude. Notably, the gallbladder does not appear distended. Gallbladder [place] appears normal in thickness. No pericholecystic fluid. Pancreas: Pancreas appears morphologically normal. MRCP images demonstrate no pancreatic ductal dilatation. Trace amount of T2 hyperintensity in the peripancreatic fat may reflect inflammation. Node large peripancreatic fluid collection identified at this time. Spleen: Unremarkable. Adrenals/Urinary Tract: Bilateral kidneys and bilateral adrenal glands are normal in appearance. No hydroureteronephrosis in the visualized portions of the abdomen. Stomach/Bowel: Visualized portions are unremarkable. Vascular/Lymphatic: No aneurysm identified in the visualized abdominal vasculature. No lymphadenopathy noted in the abdomen.  Other: Large mass in the lower abdomen and upper pelvis incompletely visualized, favored to represent a fibroid uterus. Although incompletely visualize, this mass measures up to 8.7 cm in diameter. No significant volume of ascites in the visualized peritoneal  cavity. Musculoskeletal: No aggressive osseous lesions are identified in the visualized portions of the skeleton. IMPRESSION: 1. Study is limited by lack of IV gadolinium. With these limitations in mind, the material filling much of the gallbladder is favored to reflect a combination of tumefactive sludge and small gallstones. Given the relatively normal appearance of the gallbladder [place], gallbladder carcinoma is not strongly favored at this time. 2. There are some small filling defects in the common bile duct, compatible with choledocholithiasis, and the common bile duct is mildly dilated measuring 9 mm in the porta hepatis. This is associated with very mild intrahepatic biliary ductal dilatation. 3. Small amount of T2 hyperintensity in the peripancreatic fat likely reflects underlying pancreatitis. No large peripancreatic fluid collection is noted to suggest pseudocyst at this time. 4. Small right and trace left pleural effusions with areas of increased signal intensity in the visualize lung bases which may reflect atelectasis and/or airspace consolidation. 5. Incompletely visualized mass in the pelvis. While this is favored to represent a fibroid uterus, this does warrant further evaluation with nonemergent pelvic ultrasound in the near future. 6. Additional incidental findings, as above. Electronically Signed   By: Trudie Reed M.D.   On: 07/06/2015 07:55   Mr 3d Recon At Scanner  07/06/2015  CLINICAL DATA:  82 year old female with severe aortic stenosis presenting 2 times in the past 48 hours with epigastric pain common nausea and vomiting. Elevated lipase and elevated liver function tests. Abnormal gallbladder ultrasound demonstrating mass versus tumefactive sludge. EXAM: MRI ABDOMEN WITHOUT CONTRAST  (INCLUDING MRCP) TECHNIQUE: Multiplanar multisequence MR imaging of the abdomen was performed. Heavily T2-weighted images of the biliary and pancreatic ducts were obtained, and three-dimensional MRCP  images were rendered by post processing. COMPARISON:  No prior abdominal MRI. Abdominal ultrasound 07/05/2015. FINDINGS: Comment: Study is severely limited by lack of IV gadolinium for detection of visceral and/or vascular abnormalities. Lower chest: Increased T2 signal intensity throughout the visualize right lower lobe, and to a lesser extent in portions of the left lower lobe. This is nonspecific, and may reflect underlying atelectasis and/or airspace consolidation. Small right and trace left pleural effusions. Cardiomegaly appear Hepatobiliary: No definite cystic or solid appearing hepatic lesions are identified on today's noncontrast examination. Diffuse periportal T2 hyperintensity, compatible with periportal edema. MRCP images demonstrate no intrahepatic biliary ductal dilatation. Common bile duct is mildly dilated measuring up to 9 mm in the porta hepatis. There appear to be some filling defects in the common bile duct, concerning for choledocholithiasis, largest of which measures approximately 6 mm (image 18 of series 8). MRCP images demonstrate little to no intrahepatic biliary ductal dilatation at this time. There is irregular material filling much of the gallbladder. This is T1 isointense and T2 hypointense, but is incompletely evaluated without IV gadolinium, and is favored to reflect a combination of tumefactive sludge and/or gallstones, however, underlying mass is difficult to entirely exclude. Notably, the gallbladder does not appear distended. Gallbladder [place] appears normal in thickness. No pericholecystic fluid. Pancreas: Pancreas appears morphologically normal. MRCP images demonstrate no pancreatic ductal dilatation. Trace amount of T2 hyperintensity in the peripancreatic fat may reflect inflammation. Node large peripancreatic fluid collection identified at this time. Spleen: Unremarkable. Adrenals/Urinary Tract: Bilateral kidneys and bilateral adrenal glands are normal in appearance.  No  hydroureteronephrosis in the visualized portions of the abdomen. Stomach/Bowel: Visualized portions are unremarkable. Vascular/Lymphatic: No aneurysm identified in the visualized abdominal vasculature. No lymphadenopathy noted in the abdomen. Other: Large mass in the lower abdomen and upper pelvis incompletely visualized, favored to represent a fibroid uterus. Although incompletely visualize, this mass measures up to 8.7 cm in diameter. No significant volume of ascites in the visualized peritoneal cavity. Musculoskeletal: No aggressive osseous lesions are identified in the visualized portions of the skeleton. IMPRESSION: 1. Study is limited by lack of IV gadolinium. With these limitations in mind, the material filling much of the gallbladder is favored to reflect a combination of tumefactive sludge and small gallstones. Given the relatively normal appearance of the gallbladder [place], gallbladder carcinoma is not strongly favored at this time. 2. There are some small filling defects in the common bile duct, compatible with choledocholithiasis, and the common bile duct is mildly dilated measuring 9 mm in the porta hepatis. This is associated with very mild intrahepatic biliary ductal dilatation. 3. Small amount of T2 hyperintensity in the peripancreatic fat likely reflects underlying pancreatitis. No large peripancreatic fluid collection is noted to suggest pseudocyst at this time. 4. Small right and trace left pleural effusions with areas of increased signal intensity in the visualize lung bases which may reflect atelectasis and/or airspace consolidation. 5. Incompletely visualized mass in the pelvis. While this is favored to represent a fibroid uterus, this does warrant further evaluation with nonemergent pelvic ultrasound in the near future. 6. Additional incidental findings, as above. Electronically Signed   By: Trudie Reed M.D.   On: 07/06/2015 07:55   Dg Chest Port 1 View  07/05/2015  CLINICAL DATA:   Dyspnea EXAM: PORTABLE CHEST 1 VIEW COMPARISON:  02/24/2015 FINDINGS: Cardiomegaly again noted. Central vascular congestion without convincing pulmonary edema. Small right pleural effusion with right basilar atelectasis or infiltrate. Small left basilar atelectasis or infiltrate. Atherosclerotic calcifications of thoracic aorta. IMPRESSION: Central vascular congestion without convincing pulmonary edema. Small right pleural effusion with right basilar atelectasis or infiltrate. Small left basilar atelectasis or infiltrate. Electronically Signed   By: Natasha Mead M.D.   On: 07/05/2015 08:02   Dg Ercp Biliary & Pancreatic Ducts  07/09/2015  CLINICAL DATA:  Biliary stones suspected on recent MRCP EXAM: ERCP TECHNIQUE: Multiple spot images obtained with the fluoroscopic device and submitted for interpretation post-procedure. FLUOROSCOPY TIME:  7 minutes 57 sec COMPARISON:  MRCP 07/05/2015 FINDINGS: A series of 6 fluoroscopic spot images document endoscopic cannulation and opacification of the CBD and central intrahepatic bile ducts. Subsequent images document passage of an endoscopic catheter through the duct. The central intrahepatic ducts appear decompressed. IMPRESSION: Endoscopic CBD catheter manipulation. These images were submitted for radiologic interpretation only. Please see the procedural report for the amount of contrast and the fluoroscopy time utilized. Electronically Signed   By: Corlis Leak M.D.   On: 07/09/2015 13:28     PERTINENT LAB RESULTS: CBC: No results for input(s): WBC, HGB, HCT, PLT in the last 72 hours. CMET CMP     Component Value Date/Time   NA 140 07/10/2015 0649   K 4.8 07/10/2015 0649   CL 108 07/10/2015 0649   CO2 22 07/10/2015 0649   GLUCOSE 103* 07/10/2015 0649   BUN 37* 07/10/2015 0649   CREATININE 1.30* 07/10/2015 0649   CREATININE 1.19* 05/14/2015 1041   CALCIUM 8.8* 07/10/2015 0649   PROT 5.8* 07/10/2015 0649   ALBUMIN 3.1* 07/10/2015 0649   AST 47* 07/10/2015  0649   ALT 123* 07/10/2015 0649   ALKPHOS 168* 07/10/2015 0649   BILITOT 1.1 07/10/2015 0649   GFRNONAA 35* 07/10/2015 0649   GFRAA 41* 07/10/2015 0649    GFR Estimated Creatinine Clearance: 28.3 mL/min (by C-G formula based on Cr of 1.3).  Recent Labs  07/09/15 0534  LIPASE 601*   No results for input(s): CKTOTAL, CKMB, CKMBINDEX, TROPONINI in the last 72 hours. Invalid input(s): POCBNP No results for input(s): DDIMER in the last 72 hours. No results for input(s): HGBA1C in the last 72 hours. No results for input(s): CHOL, HDL, LDLCALC, TRIG, CHOLHDL, LDLDIRECT in the last 72 hours. No results for input(s): TSH, T4TOTAL, T3FREE, THYROIDAB in the last 72 hours.  Invalid input(s): FREET3 No results for input(s): VITAMINB12, FOLATE, FERRITIN, TIBC, IRON, RETICCTPCT in the last 72 hours. Coags:  Recent Labs  07/08/15 1226  INR 1.35   Microbiology: Recent Results (from the past 240 hour(s))  MRSA PCR Screening     Status: Abnormal   Collection Time: 07/05/15  5:45 PM  Result Value Ref Range Status   MRSA by PCR POSITIVE (A) NEGATIVE Final    Comment:        The GeneXpert MRSA Assay (FDA approved for NASAL specimens only), is one component of a comprehensive MRSA colonization surveillance program. It is not intended to diagnose MRSA infection nor to guide or monitor treatment for MRSA infections. RESULT CALLED TO, READ BACK BY AND VERIFIED WITH: Devoria Albe RN [id number] 07/05/15 A BROWNING      BRIEF HOSPITAL COURSE:  Acute gallstone pancreatitis: Improved, underwent ERCP on 3/10. Doing well post ERCP-tolerating soft diet-no nausea this am. Had BM this am as well. Belly is soft without any tenderness. Stable for discharge today  Active Problems: Choledocholithiasis:likely causing above, successfully underwent ERCP on 3/10. Given no cholecystitis-suspect we can hold off on doing pursuing a laparoscopic cholecystectomy given severe aortic stenosis and other medical  comorbidities.Family and patient agreeable with this plan. They understand that she will remain at risk for pancreatitis in the future-but given advanced age, severe AS-they understand that she will be high risk candidate for any surgical procedures.  Acute on chronic kidney disease stage III: Improving with supportive care.  HTN (hypertension): controlled, resume low dose coreg and lasix on discharge  GERD (gastroesophageal reflux disease): Continue PPI  Dyslipidemia: Hold statin given elevated LFTs. Resume when LFT's have normalized  Severe aortic stenosis: Cardiology was consulted for preoperative risk assessment- deemed to be a high-risk candidate for ERCP and cholecystectomy. After discussion with family, ERCP successfully treated on 3/10. Family has chosen not to pursue cholecystectomy at this time.  Chronic diastolic heart failure: Clinically compensated-esume Lasix.   Deconditioning:PT evaluation completed-recommendations were for HHPT-Family at this time refuses home health physical therapy.  TODAY-DAY OF DISCHARGE:  Subjective:   Ambermarie Currier today has no headache,no chest abdominal pain,no new weakness tingling or numbness, feels much better wants to go home today.   Objective:   Blood pressure 118/58, pulse 91, temperature 98.2 F (36.8 C), temperature source Oral, resp. rate 18, height 5\' 2"  (1.575 m), weight 80.65 kg (177 lb 12.8 oz), SpO2 93 %.  Intake/Output Summary (Last 24 hours) at 07/11/15 1006 Last data filed at 07/11/15 0829  Gross per 24 hour  Intake 1136.5 ml  Output   2250 ml  Net -1113.5 ml   Filed Weights   07/09/15 0640 07/10/15 0441 07/11/15 0605  Weight: 78.5 kg (173 lb 1 oz) 80.922 kg (178  lb 6.4 oz) 80.65 kg (177 lb 12.8 oz)    Exam Awake Alert, Oriented *3, No new F.N deficits, Normal affect [place].AT,PERRAL Supple Neck,No JVD, No cervical lymphadenopathy appriciated.  Symmetrical Chest Kollman movement, Good air movement bilaterally, CTAB RRR,No  Gallops,Rubs or new Murmurs, No Parasternal Heave +ve B.Sounds, Abd Soft, Non tender, No organomegaly appriciated, No rebound -guarding or rigidity. No Cyanosis, Clubbing or edema, No new Rash or bruise  DISCHARGE CONDITION: Stable  DISPOSITION: Home  DISCHARGE INSTRUCTIONS:    Activity:  As tolerated with Full fall precautions use walker/cane & assistance as needed  Get Medicines reviewed and adjusted: Please take all your medications with you for your next visit with your Primary MD  Please request your Primary MD to go over all hospital tests and procedure/radiological results at the follow up, please ask your Primary MD to get all Hospital records sent to his/her office.  If you experience worsening of your admission symptoms, develop shortness of breath, life threatening emergency, suicidal or homicidal thoughts you must seek medical attention immediately by calling 911 or calling your MD immediately  if symptoms less severe.  You must read complete instructions/literature along with all the possible adverse reactions/side effects for all the Medicines you take and that have been prescribed to you. Take any new Medicines after you have completely understood and accpet all the possible adverse reactions/side effects.   Do not drive when taking Pain medications.   Do not take more than prescribed Pain, Sleep and Anxiety Medications  Special Instructions: If you have smoked or chewed Tobacco  in the last 2 yrs please stop smoking, stop any regular Alcohol  and or any Recreational drug use.  Wear Seat belts while driving.  Please note  You were cared for by a hospitalist during your hospital stay. Once you are discharged, your primary care physician will handle any further medical issues. Please note that NO REFILLS for any discharge medications will be authorized once you are discharged, as it is imperative that you return to your primary care physician (or establish a relationship  with a primary care physician if you do not have one) for your aftercare needs so that they can reassess your need for medications and monitor your lab values.   Diet recommendation: Heart Healthy diet-soft diet for 1-2 weeks  Discharge Instructions    Diet - low sodium heart healthy    Complete by:  As directed   Soft diet for 1-2 weeks     Increase activity slowly    Complete by:  As directed            Follow-up Information    Follow up with SHAW,KIMBERLEE, MD. Schedule an appointment as soon as possible for a visit in 1 week.   Specialty:  Family Medicine   Why:  Hospital follow up, Repeat Liver Function Tests, Repeat electrolytes   Contact information:   [address] [place] [address] [phone]      Total Time spent on discharge equals  45 minutes.  SignedJeoffrey Massed: Lizzeth Meder 07/11/2015 10:06 AM

## 2015-07-11 NOTE — Progress Notes (Signed)
Pt DC to home accomp by family.  Family did not want any home PT although this was offered many times. Rx given and explained. DC instructions copy given and explained.

## 2015-07-15 ENCOUNTER — Emergency Department (HOSPITAL_COMMUNITY): Payer: Medicare Other

## 2015-07-15 ENCOUNTER — Inpatient Hospital Stay (HOSPITAL_COMMUNITY)
Admission: EM | Admit: 2015-07-15 | Discharge: 2015-07-31 | DRG: 871 | Disposition: E | Payer: Medicare Other | Attending: Internal Medicine | Admitting: Internal Medicine

## 2015-07-15 ENCOUNTER — Encounter (HOSPITAL_COMMUNITY): Payer: Self-pay

## 2015-07-15 DIAGNOSIS — R0602 Shortness of breath: Secondary | ICD-10-CM | POA: Diagnosis not present

## 2015-07-15 DIAGNOSIS — N179 Acute kidney failure, unspecified: Secondary | ICD-10-CM | POA: Diagnosis not present

## 2015-07-15 DIAGNOSIS — I38 Endocarditis, valve unspecified: Secondary | ICD-10-CM | POA: Diagnosis present

## 2015-07-15 DIAGNOSIS — J9601 Acute respiratory failure with hypoxia: Secondary | ICD-10-CM | POA: Diagnosis present

## 2015-07-15 DIAGNOSIS — Z7982 Long term (current) use of aspirin: Secondary | ICD-10-CM | POA: Diagnosis not present

## 2015-07-15 DIAGNOSIS — N183 Chronic kidney disease, stage 3 (moderate): Secondary | ICD-10-CM | POA: Diagnosis present

## 2015-07-15 DIAGNOSIS — E875 Hyperkalemia: Secondary | ICD-10-CM | POA: Diagnosis not present

## 2015-07-15 DIAGNOSIS — Z515 Encounter for palliative care: Secondary | ICD-10-CM | POA: Diagnosis not present

## 2015-07-15 DIAGNOSIS — R6520 Severe sepsis without septic shock: Secondary | ICD-10-CM | POA: Diagnosis present

## 2015-07-15 DIAGNOSIS — R0600 Dyspnea, unspecified: Secondary | ICD-10-CM

## 2015-07-15 DIAGNOSIS — I5043 Acute on chronic combined systolic (congestive) and diastolic (congestive) heart failure: Secondary | ICD-10-CM | POA: Diagnosis present

## 2015-07-15 DIAGNOSIS — R112 Nausea with vomiting, unspecified: Secondary | ICD-10-CM

## 2015-07-15 DIAGNOSIS — I959 Hypotension, unspecified: Secondary | ICD-10-CM | POA: Diagnosis present

## 2015-07-15 DIAGNOSIS — Z79899 Other long term (current) drug therapy: Secondary | ICD-10-CM | POA: Diagnosis not present

## 2015-07-15 DIAGNOSIS — I248 Other forms of acute ischemic heart disease: Secondary | ICD-10-CM | POA: Diagnosis present

## 2015-07-15 DIAGNOSIS — J9811 Atelectasis: Secondary | ICD-10-CM | POA: Diagnosis present

## 2015-07-15 DIAGNOSIS — I130 Hypertensive heart and chronic kidney disease with heart failure and stage 1 through stage 4 chronic kidney disease, or unspecified chronic kidney disease: Secondary | ICD-10-CM | POA: Diagnosis present

## 2015-07-15 DIAGNOSIS — E785 Hyperlipidemia, unspecified: Secondary | ICD-10-CM | POA: Diagnosis present

## 2015-07-15 DIAGNOSIS — F419 Anxiety disorder, unspecified: Secondary | ICD-10-CM | POA: Diagnosis not present

## 2015-07-15 DIAGNOSIS — I350 Nonrheumatic aortic (valve) stenosis: Secondary | ICD-10-CM | POA: Diagnosis not present

## 2015-07-15 DIAGNOSIS — Z66 Do not resuscitate: Secondary | ICD-10-CM | POA: Diagnosis not present

## 2015-07-15 DIAGNOSIS — K8064 Calculus of gallbladder and bile duct with chronic cholecystitis without obstruction: Secondary | ICD-10-CM | POA: Diagnosis present

## 2015-07-15 DIAGNOSIS — K8050 Calculus of bile duct without cholangitis or cholecystitis without obstruction: Secondary | ICD-10-CM | POA: Diagnosis present

## 2015-07-15 DIAGNOSIS — Y95 Nosocomial condition: Secondary | ICD-10-CM | POA: Diagnosis present

## 2015-07-15 DIAGNOSIS — A419 Sepsis, unspecified organism: Principal | ICD-10-CM

## 2015-07-15 DIAGNOSIS — I5033 Acute on chronic diastolic (congestive) heart failure: Secondary | ICD-10-CM | POA: Diagnosis not present

## 2015-07-15 DIAGNOSIS — R109 Unspecified abdominal pain: Secondary | ICD-10-CM

## 2015-07-15 DIAGNOSIS — D649 Anemia, unspecified: Secondary | ICD-10-CM | POA: Diagnosis present

## 2015-07-15 DIAGNOSIS — R748 Abnormal levels of other serum enzymes: Secondary | ICD-10-CM

## 2015-07-15 DIAGNOSIS — N19 Unspecified kidney failure: Secondary | ICD-10-CM

## 2015-07-15 DIAGNOSIS — K219 Gastro-esophageal reflux disease without esophagitis: Secondary | ICD-10-CM | POA: Diagnosis present

## 2015-07-15 DIAGNOSIS — K859 Acute pancreatitis without necrosis or infection, unspecified: Secondary | ICD-10-CM | POA: Diagnosis not present

## 2015-07-15 DIAGNOSIS — K8020 Calculus of gallbladder without cholecystitis without obstruction: Secondary | ICD-10-CM

## 2015-07-15 DIAGNOSIS — I509 Heart failure, unspecified: Secondary | ICD-10-CM

## 2015-07-15 DIAGNOSIS — E872 Acidosis: Secondary | ICD-10-CM | POA: Diagnosis not present

## 2015-07-15 DIAGNOSIS — J189 Pneumonia, unspecified organism: Secondary | ICD-10-CM | POA: Diagnosis present

## 2015-07-15 LAB — URINE MICROSCOPIC-ADD ON

## 2015-07-15 LAB — COMPREHENSIVE METABOLIC PANEL
ALK PHOS: 122 U/L (ref 38–126)
ALT: 82 U/L — ABNORMAL HIGH (ref 14–54)
AST: 83 U/L — ABNORMAL HIGH (ref 15–41)
Albumin: 3.1 g/dL — ABNORMAL LOW (ref 3.5–5.0)
Anion gap: 10 (ref 5–15)
BUN: 52 mg/dL — ABNORMAL HIGH (ref 6–20)
CALCIUM: 8.6 mg/dL — AB (ref 8.9–10.3)
CO2: 19 mmol/L — ABNORMAL LOW (ref 22–32)
CREATININE: 1.82 mg/dL — AB (ref 0.44–1.00)
Chloride: 106 mmol/L (ref 101–111)
GFR calc non Af Amer: 23 mL/min — ABNORMAL LOW (ref >60)
GFR, EST AFRICAN AMERICAN: 27 mL/min — AB (ref >60)
Glucose, Bld: 110 mg/dL — ABNORMAL HIGH (ref 65–99)
Potassium: >7.5 mmol/L (ref 3.5–5.1)
Sodium: 135 mmol/L (ref 135–145)
TOTAL PROTEIN: 6.1 g/dL — AB (ref 6.5–8.1)
Total Bilirubin: 1 mg/dL (ref 0.3–1.2)

## 2015-07-15 LAB — I-STAT CG4 LACTIC ACID, ED: LACTIC ACID, VENOUS: 3.17 mmol/L — AB (ref 0.5–2.0)

## 2015-07-15 LAB — LIPASE, BLOOD: Lipase: 151 U/L — ABNORMAL HIGH (ref 11–51)

## 2015-07-15 LAB — CBC
HCT: 35.2 % — ABNORMAL LOW (ref 36.0–46.0)
Hemoglobin: 10.6 g/dL — ABNORMAL LOW (ref 12.0–15.0)
MCH: 27.3 pg (ref 26.0–34.0)
MCHC: 30.1 g/dL (ref 30.0–36.0)
MCV: 90.7 fL (ref 78.0–100.0)
PLATELETS: 147 10*3/uL — AB (ref 150–400)
RBC: 3.88 MIL/uL (ref 3.87–5.11)
RDW: 18.5 % — ABNORMAL HIGH (ref 11.5–15.5)
WBC: 23.5 10*3/uL — AB (ref 4.0–10.5)

## 2015-07-15 LAB — URINALYSIS, ROUTINE W REFLEX MICROSCOPIC
Glucose, UA: NEGATIVE mg/dL
Ketones, ur: NEGATIVE mg/dL
LEUKOCYTES UA: NEGATIVE
Nitrite: NEGATIVE
PROTEIN: NEGATIVE mg/dL
Specific Gravity, Urine: 1.019 (ref 1.005–1.030)
pH: 5 (ref 5.0–8.0)

## 2015-07-15 LAB — I-STAT TROPONIN, ED: TROPONIN I, POC: 0.3 ng/mL — AB (ref 0.00–0.08)

## 2015-07-15 LAB — BRAIN NATRIURETIC PEPTIDE: B Natriuretic Peptide: >4500 pg/mL — ABNORMAL HIGH (ref 0.0–100.0)

## 2015-07-15 MED ORDER — SODIUM CHLORIDE 0.9 % IV SOLN
1.0000 g | Freq: Once | INTRAVENOUS | Status: AC
  Administered 2015-07-15: 1 g via INTRAVENOUS
  Filled 2015-07-15: qty 10

## 2015-07-15 MED ORDER — VANCOMYCIN HCL IN DEXTROSE 1-5 GM/200ML-% IV SOLN
1000.0000 mg | Freq: Once | INTRAVENOUS | Status: AC
  Administered 2015-07-15: 1000 mg via INTRAVENOUS
  Filled 2015-07-15: qty 200

## 2015-07-15 MED ORDER — SODIUM CHLORIDE 0.9 % IV BOLUS (SEPSIS): 1000.0000 mL | Freq: Once | INTRAVENOUS | Status: DC

## 2015-07-15 MED ORDER — INSULIN ASPART 100 UNIT/ML IV SOLN
10.0000 [IU] | Freq: Once | INTRAVENOUS | Status: AC
  Administered 2015-07-15: 10 [IU] via INTRAVENOUS
  Filled 2015-07-15: qty 1

## 2015-07-15 MED ORDER — DEXTROSE 50 % IV SOLN
1.0000 | Freq: Once | INTRAVENOUS | Status: AC
  Administered 2015-07-15: 50 mL via INTRAVENOUS
  Filled 2015-07-15: qty 50

## 2015-07-15 MED ORDER — PIPERACILLIN-TAZOBACTAM 3.375 G IVPB 30 MIN
3.3750 g | Freq: Once | INTRAVENOUS | Status: AC
  Administered 2015-07-15: 3.375 g via INTRAVENOUS
  Filled 2015-07-15: qty 50

## 2015-07-15 MED ORDER — SODIUM POLYSTYRENE SULFONATE 15 GM/60ML PO SUSP
45.0000 g | Freq: Once | ORAL | Status: AC
  Administered 2015-07-15: 45 g via RECTAL
  Filled 2015-07-15: qty 180

## 2015-07-15 NOTE — H&P (Signed)
[facility] History and Physical  Arva Chafeannie M Winders ZOX:[id number] DOB: May 22, 1922 DOA: 06/30/2015  Referring physician: ED  PCP: Lupita RaiderSHAW,KIMBERLEE, MD   Chief Complaint: Emesis and shortness of breath  HPI:  Ms. Rebecca Woodard is a 82 year old female with a past medical history significant for severe aortic stenosis, combined systolic and diastolic congestive heart failure last EF 45-50% in 12/2014, hypertension, cholelithiasis; who presents with complaints of emesis and shortness of breath. No patient was just discharged from the hospital on 3/12 with acute gallstone pancreatitis undergoing ERCP on 3/10. At that time her gallbladder was not removed due to history of severe aortic stenosis. Since the patient has been home they note that she's had decreased appetite, abdominal pain, and intermittent emesis. However, today symptoms worsen and she's had 7-8 episodes of vomiting and was complaining significantly of worsening right upper quadrant pain. Family was concerned that she may have been suffering from similar symptoms. Denies any acute chest pain, or fever. Associated symptoms include chills, generalized weakness, and worsening shortness of breath. Family notes the patient was able to ambulate previously but has not walked really since she got home from her last hospitalization.  Upon admission patient is evaluated and seen to have a elevated respiratory rate 29, blood pressure 91/40, WBC 23.5, hemoglobin 10.6, potassium 7.5, BUN 52 creatinine 1.82, lipase 151, AST 83, ALT 82. Patient was given 10 units of insulin with dextrose, 1g calcium gluconate, and 45 g Kayexalate  while in the ED.    Review of Systems  Constitutional: Positive for chills and malaise/fatigue. Negative for fever.  HENT: Positive for hearing loss. Negative for ear discharge.   Eyes: Negative for photophobia and pain.  Respiratory: Positive for shortness of breath.   Cardiovascular: Positive for leg swelling.  Gastrointestinal:  Positive for nausea, vomiting and abdominal pain.  Genitourinary: Negative for frequency and hematuria.  Musculoskeletal: Positive for joint pain.  Skin: Negative for itching and rash.  Neurological: Positive for weakness. Negative for speech change and focal weakness.  Endo/Heme/Allergies: Negative for environmental allergies and polydipsia.  Psychiatric/Behavioral: Positive for memory loss. Negative for substance abuse.        Past Medical History  Diagnosis Date  . Acid reflux   . Gallstones   . Heart murmur   . Inner ear disease   . HTN (hypertension) 01/09/2015  . GERD (gastroesophageal reflux disease) 01/09/2015  . Hyperlipidemia 01/09/2015  . History of gallstones 01/09/2015  . Anemia 01/09/2015     Past Surgical History  Procedure Laterality Date  . Tonsilectomy, adenoidectomy, bilateral myringotomy and tubes Bilateral   . Appendectomy Bilateral   . Ercp [name] 07/09/2015    Procedure: ENDOSCOPIC RETROGRADE CHOLANGIOPANCREATOGRAPHY (ERCP);  Surgeon: Dorena CookeyJohn Hayes, MD;  [facility];  Service: Endoscopy;  Laterality: [name];      Social History:  reports that she has never smoked. She does not have any smokeless tobacco history on file. She reports that she does not drink alcohol or use illicit drugs.   Allergies  Allergen Reactions  . Amoxicillin Other (See Comments)    Intolerance per son  . Salicylates Other (See Comments)    All nsaids - unknown reaction    Family History  Problem Relation Age of Onset  . Heart failure Mother   . Dementia Father   . Heart attack Father         Prior to Admission medications   Medication Sig Start Date End Date Taking? Authorizing Provider  aspirin EC 81 MG EC tablet Take  1 tablet (81 mg total) by mouth daily. 01/11/15  Yes Leroy Sea, MD  carvedilol (COREG) 3.125 MG tablet Take 1 tablet (3.125 mg total) by mouth 2 (two) times daily with a meal. 01/11/15  Yes Leroy Sea, MD  furosemide (LASIX) 40 MG tablet  TAKE 1 TABLET (40 MG TOTAL) BY MOUTH DAILY. Patient taking differently: TAKE 1 TABLET (40 MG TOTAL) BY MOUTH DAILY, MAY TAKE ANOTHER TABLET DURING THE DAY IF NEEDED FOR SWELLING/ SHORTNESS OF BREATH 06/16/15  Yes Hao Meng, PA  KLOR-CON M20 20 MEQ tablet TAKE 2 TABLETS (40 MEQ TOTAL) BY MOUTH DAILY. TAKE WITH LASIX TO REPLACE POTASSIUM Patient taking differently: TAKE 1 TABLET BY MOUTH TWICE DAILY. TAKE WITH LASIX TO REPLACE POTASSIUM 06/16/15  Yes Azalee Course, PA  omeprazole (PRILOSEC) 20 MG capsule Take 20 mg by mouth 2 (two) times daily. 06/16/15  Yes Historical Provider, MD  ondansetron (ZOFRAN ODT) 4 MG disintegrating tablet Take 1 tablet (4 mg total) by mouth every 8 (eight) hours as needed for nausea or vomiting. 07/11/15  Yes Shanker Levora Dredge, MD  OVER THE COUNTER MEDICATION Over the counter motion sickness pill - take 1 tablet by mouth every morning and may also take 1 or 2 tablets during the day as needed for nausea   Yes Historical Provider, MD  simethicone (MYLICON) 80 MG chewable tablet Chew 80 mg by mouth every 6 (six) hours as needed for flatulence.   Yes Historical Provider, MD  oxyCODONE (ROXICODONE) 5 MG immediate release tablet Take 1 tablet (5 mg total) by mouth every 6 (six) hours as needed for severe pain. Patient not taking: Reported on 07/17/2015 07/11/15   Maretta Bees, MD     Physical Exam: Filed Vitals:   07/29/2015 1703 07/07/2015 2015 07/17/2015 2040  BP: 91/40 111/54   Pulse: 75 31   Temp: 97.5 F (36.4 C)  98.9 F (37.2 C)  TempSrc: Axillary  Rectal  Resp: 26 16   SpO2: 97% 91%      Constitutional: Vital signs reviewed. Patient is a elderly female who is ill-appearing, but oriented to person and place Head: Normocephalic and atraumatic  Ear: TM normal bilaterally  Mouth: no erythema or exudates, MMM  Eyes: PERRL, EOMI, conjunctivae normal, No scleral icterus.  Neck: Supple, Trachea midline normal ROM, + JVD, mass, thyromegaly, or carotid bruit present.   Cardiovascular: Regular rate and rhythm with positive  systolic ejection murmur Pulmonary/Chest: Increased work of breathing with decreased overall air movement Abdominal: Soft. Tender to palpation over the right upper quadrant  GU: no CVA tenderness Musculoskeletal: No joint deformities, erythema, or stiffness, ROM full and no nontender Ext: 3+ pitting edema bilateral lower extremities and no cyanosis, pulses palpable bilaterally (DP and PT)  Hematology: no cervical, inginal, or axillary adenopathy.  Neurological: generalized weakness 4 out of 5 and symmetric, cranial nerve II-XII are grossly intact, no focal motor deficit, sensory intact to light touch bilaterally.  Skin: Warm, dry and intact. No rash, cyanosis, or clubbing.  Psychiatric: Confused      Data Review   Micro Results No results found for this or any previous visit (from the past 240 hour(s)).  Radiology Reports Dg Chest 2 View  07/04/2015  CLINICAL DATA:  Shortness of breath with fatigue today. History of hypertension. EXAM: CHEST  2 VIEW COMPARISON:  Chest x-rays dated 07/05/2015 and 02/24/2015. FINDINGS: Increasing opacity within the right lower lung. No significant change on the left, with probable stable atelectasis and/or small  pleural effusion. Cardiomegaly is again noted. Overall cardiomediastinal silhouette appears stable in size and configuration. Osseous structures about the chest are unremarkable. Mild degenerative change and ankylosis throughout the kyphotic thoracic spine. IMPRESSION: 1. Increasing opacity within the right lower lung, most likely an increasing pleural effusion combined with atelectasis. If febrile, an underlying pneumonia could not be excluded. 2. Stable appearance of the left lung with probable stable atelectasis and/or small effusion. 3. Stable cardiomegaly. Electronically Signed   By: Bary Richard M.D.   On: 08/07/15 17:54   US Abdomen Complete  07/05/2015  CLINICAL DATA:  Generalized  abdominal pain, nausea vomiting since Sunday. Elevated lipase. EXAM: ABDOMEN ULTRASOUND COMPLETE COMPARISON:  Ultrasound 09/22/2009 FINDINGS: Gallbladder: Irregular lobular masslike lesion is centrally within the lumen of the gallbladder measures 6 cm x 2.7 cm by 3.0 cm. There is mild posterior shadowing associated this lobular masslike lesion. No significant vascular flow by color Doppler ultrasound. The gallbladder Boberg is thickened tube 7 mm. No pericholecystic fluid. Negative sonographic Murphy's sign. Common bile duct: Diameter: Common bile duct is distended to 8 to 10 mm. Liver: No biliary duct dilatation identified. No mass lesion. Normal echogenicity. IVC: No abnormality visualized. Pancreas: Not well visualized Spleen: Size and appearance within normal limits. Right Kidney: Length: 9.8 cm. Echogenicity within normal limits. No mass or hydronephrosis visualized. Left Kidney: Length: 9.6 cm. Echogenicity within normal limits. No mass or hydronephrosis visualized. Abdominal aorta: No aneurysm visualized. Other findings: Bilateral small pleural effusions. IMPRESSION: 1. Lobular masslike lesion within the lumen of the gallbladder new from prior. Differential includes a tumefactive sludge versus gallbladder carcinoma. No vascularity within the mass suggesting favors tumefactive sludge. 2. Dilatation the common bile duct to 8 to 10 mm. 3. With this combination of gallbladder mass and common bile duct dilatation, recommend MRI / MRCP with contrast. If patient is not a good candidate for MRI (i.e. cannot hold breath for 15 to 20 seconds and follow voice commands) then recommend CT of the abdomen pelvis with contrast. Electronically Signed   By: Genevive Bi M.D.   On: 07/05/2015 09:55   Mr Abdomen Mrcp Wo Cm  07/06/2015  CLINICAL DATA:  82 year old female with severe aortic stenosis presenting 2 times in the past 48 hours with epigastric pain common nausea and vomiting. Elevated lipase and elevated liver  function tests. Abnormal gallbladder ultrasound demonstrating mass versus tumefactive sludge. EXAM: MRI ABDOMEN WITHOUT CONTRAST  (INCLUDING MRCP) TECHNIQUE: Multiplanar multisequence MR imaging of the abdomen was performed. Heavily T2-weighted images of the biliary and pancreatic ducts were obtained, and three-dimensional MRCP images were rendered by post processing. COMPARISON:  No prior abdominal MRI. Abdominal ultrasound 07/05/2015. FINDINGS: Comment: Study is severely limited by lack of IV gadolinium for detection of visceral and/or vascular abnormalities. Lower chest: Increased T2 signal intensity throughout the visualize right lower lobe, and to a lesser extent in portions of the left lower lobe. This is nonspecific, and may reflect underlying atelectasis and/or airspace consolidation. Small right and trace left pleural effusions. Cardiomegaly appear Hepatobiliary: No definite cystic or solid appearing hepatic lesions are identified on today's noncontrast examination. Diffuse periportal T2 hyperintensity, compatible with periportal edema. MRCP images demonstrate no intrahepatic biliary ductal dilatation. Common bile duct is mildly dilated measuring up to 9 mm in the porta hepatis. There appear to be some filling defects in the common bile duct, concerning for choledocholithiasis, largest of which measures approximately 6 mm (image 18 of series 8). MRCP images demonstrate little to no intrahepatic biliary ductal  dilatation at this time. There is irregular material filling much of the gallbladder. This is T1 isointense and T2 hypointense, but is incompletely evaluated without IV gadolinium, and is favored to reflect a combination of tumefactive sludge and/or gallstones, however, underlying mass is difficult to entirely exclude. Notably, the gallbladder does not appear distended. Gallbladder Buboltz appears normal in thickness. No pericholecystic fluid. Pancreas: Pancreas appears morphologically normal. MRCP images  demonstrate no pancreatic ductal dilatation. Trace amount of T2 hyperintensity in the peripancreatic fat may reflect inflammation. Node large peripancreatic fluid collection identified at this time. Spleen: Unremarkable. Adrenals/Urinary Tract: Bilateral kidneys and bilateral adrenal glands are normal in appearance. No hydroureteronephrosis in the visualized portions of the abdomen. Stomach/Bowel: Visualized portions are unremarkable. Vascular/Lymphatic: No aneurysm identified in the visualized abdominal vasculature. No lymphadenopathy noted in the abdomen. Other: Large mass in the lower abdomen and upper pelvis incompletely visualized, favored to represent a fibroid uterus. Although incompletely visualize, this mass measures up to 8.7 cm in diameter. No significant volume of ascites in the visualized peritoneal cavity. Musculoskeletal: No aggressive osseous lesions are identified in the visualized portions of the skeleton. IMPRESSION: 1. Study is limited by lack of IV gadolinium. With these limitations in mind, the material filling much of the gallbladder is favored to reflect a combination of tumefactive sludge and small gallstones. Given the relatively normal appearance of the gallbladder Garrelts, gallbladder carcinoma is not strongly favored at this time. 2. There are some small filling defects in the common bile duct, compatible with choledocholithiasis, and the common bile duct is mildly dilated measuring 9 mm in the porta hepatis. This is associated with very mild intrahepatic biliary ductal dilatation. 3. Small amount of T2 hyperintensity in the peripancreatic fat likely reflects underlying pancreatitis. No large peripancreatic fluid collection is noted to suggest pseudocyst at this time. 4. Small right and trace left pleural effusions with areas of increased signal intensity in the visualize lung bases which may reflect atelectasis and/or airspace consolidation. 5. Incompletely visualized mass in the pelvis.  While this is favored to represent a fibroid uterus, this does warrant further evaluation with nonemergent pelvic ultrasound in the near future. 6. Additional incidental findings, as above. Electronically Signed   By: Trudie Reed M.D.   On: 07/06/2015 07:55   Mr 3d Recon At Scanner  07/06/2015  CLINICAL DATA:  82 year old female with severe aortic stenosis presenting 2 times in the past 48 hours with epigastric pain common nausea and vomiting. Elevated lipase and elevated liver function tests. Abnormal gallbladder ultrasound demonstrating mass versus tumefactive sludge. EXAM: MRI ABDOMEN WITHOUT CONTRAST  (INCLUDING MRCP) TECHNIQUE: Multiplanar multisequence MR imaging of the abdomen was performed. Heavily T2-weighted images of the biliary and pancreatic ducts were obtained, and three-dimensional MRCP images were rendered by post processing. COMPARISON:  No prior abdominal MRI. Abdominal ultrasound 07/05/2015. FINDINGS: Comment: Study is severely limited by lack of IV gadolinium for detection of visceral and/or vascular abnormalities. Lower chest: Increased T2 signal intensity throughout the visualize right lower lobe, and to a lesser extent in portions of the left lower lobe. This is nonspecific, and may reflect underlying atelectasis and/or airspace consolidation. Small right and trace left pleural effusions. Cardiomegaly appear Hepatobiliary: No definite cystic or solid appearing hepatic lesions are identified on today's noncontrast examination. Diffuse periportal T2 hyperintensity, compatible with periportal edema. MRCP images demonstrate no intrahepatic biliary ductal dilatation. Common bile duct is mildly dilated measuring up to 9 mm in the porta hepatis. There appear to be some filling defects  in the common bile duct, concerning for choledocholithiasis, largest of which measures approximately 6 mm (image 18 of series 8). MRCP images demonstrate little to no intrahepatic biliary ductal dilatation at  this time. There is irregular material filling much of the gallbladder. This is T1 isointense and T2 hypointense, but is incompletely evaluated without IV gadolinium, and is favored to reflect a combination of tumefactive sludge and/or gallstones, however, underlying mass is difficult to entirely exclude. Notably, the gallbladder does not appear distended. Gallbladder Wienke appears normal in thickness. No pericholecystic fluid. Pancreas: Pancreas appears morphologically normal. MRCP images demonstrate no pancreatic ductal dilatation. Trace amount of T2 hyperintensity in the peripancreatic fat may reflect inflammation. Node large peripancreatic fluid collection identified at this time. Spleen: Unremarkable. Adrenals/Urinary Tract: Bilateral kidneys and bilateral adrenal glands are normal in appearance. No hydroureteronephrosis in the visualized portions of the abdomen. Stomach/Bowel: Visualized portions are unremarkable. Vascular/Lymphatic: No aneurysm identified in the visualized abdominal vasculature. No lymphadenopathy noted in the abdomen. Other: Large mass in the lower abdomen and upper pelvis incompletely visualized, favored to represent a fibroid uterus. Although incompletely visualize, this mass measures up to 8.7 cm in diameter. No significant volume of ascites in the visualized peritoneal cavity. Musculoskeletal: No aggressive osseous lesions are identified in the visualized portions of the skeleton. IMPRESSION: 1. Study is limited by lack of IV gadolinium. With these limitations in mind, the material filling much of the gallbladder is favored to reflect a combination of tumefactive sludge and small gallstones. Given the relatively normal appearance of the gallbladder Marrow, gallbladder carcinoma is not strongly favored at this time. 2. There are some small filling defects in the common bile duct, compatible with choledocholithiasis, and the common bile duct is mildly dilated measuring 9 mm in the porta  hepatis. This is associated with very mild intrahepatic biliary ductal dilatation. 3. Small amount of T2 hyperintensity in the peripancreatic fat likely reflects underlying pancreatitis. No large peripancreatic fluid collection is noted to suggest pseudocyst at this time. 4. Small right and trace left pleural effusions with areas of increased signal intensity in the visualize lung bases which may reflect atelectasis and/or airspace consolidation. 5. Incompletely visualized mass in the pelvis. While this is favored to represent a fibroid uterus, this does warrant further evaluation with nonemergent pelvic ultrasound in the near future. 6. Additional incidental findings, as above. Electronically Signed   By: Trudie Reed M.D.   On: 07/06/2015 07:55   Dg Chest Port 1 View  07/05/2015  CLINICAL DATA:  Dyspnea EXAM: PORTABLE CHEST 1 VIEW COMPARISON:  02/24/2015 FINDINGS: Cardiomegaly again noted. Central vascular congestion without convincing pulmonary edema. Small right pleural effusion with right basilar atelectasis or infiltrate. Small left basilar atelectasis or infiltrate. Atherosclerotic calcifications of thoracic aorta. IMPRESSION: Central vascular congestion without convincing pulmonary edema. Small right pleural effusion with right basilar atelectasis or infiltrate. Small left basilar atelectasis or infiltrate. Electronically Signed   By: Natasha Mead M.D.   On: 07/05/2015 08:02   Dg Ercp Biliary & Pancreatic Ducts  07/09/2015  CLINICAL DATA:  Biliary stones suspected on recent MRCP EXAM: ERCP TECHNIQUE: Multiple spot images obtained with the fluoroscopic device and submitted for interpretation post-procedure. FLUOROSCOPY TIME:  7 minutes 57 sec COMPARISON:  MRCP 07/05/2015 FINDINGS: A series of 6 fluoroscopic spot images document endoscopic cannulation and opacification of the CBD and central intrahepatic bile ducts. Subsequent images document passage of an endoscopic catheter through the duct. The  central intrahepatic ducts appear decompressed. IMPRESSION: Endoscopic CBD catheter  manipulation. These images were submitted for radiologic interpretation only. Please see the procedural report for the amount of contrast and the fluoroscopy time utilized. Electronically Signed   By: Corlis Leak M.D.   On: 07/09/2015 13:28   US Abdomen Limited Ruq  2015-08-08  CLINICAL DATA:  Abdominal pain, nausea and vomiting for 2 weeks. History of gallstones. EXAM: US ABDOMEN LIMITED - RIGHT UPPER QUADRANT COMPARISON:  None. FINDINGS: Gallbladder: Gallbladder is nearly completely filled with stones and sludge. Gallbladder Decoster is thickened to 7 mm. No pericholecystic fluid. No sonographic Murphy sign elicited during the exam, per the sonographer. Common bile duct: Diameter: Within normal limits at 7 mm. Liver: No focal lesion identified. Within normal limits in parenchymal echogenicity. Incidental note is made of a pleural effusion at the right lung base. Also noted is a small amount of ascites. IMPRESSION: 1. Gallbladder nearly completely filled with stones and/or sludge. Gallbladder Linhart thickened to 7 mm. Gallbladder Tift thickening is a nonspecific finding with differential considerations including acute or chronic cholecystitis, hypoproteinemia, and reactive thickening due to adjacent liver disease. The absence of a positive sonographic Murphy's sign and pericholecystic fluid makes acute cholecystitis less likely. 2. Right pleural effusion.  Small ascites Electronically Signed   By: Bary Richard M.D.   On: 08-Aug-2015 21:17     CBC  Recent Labs Lab 2015/08/08 1947  WBC 23.5*  HGB 10.6*  HCT 35.2*  PLT 147*  MCV 90.7  MCH 27.3  MCHC 30.1  RDW 18.5*    Chemistries   Recent Labs Lab 07/09/15 0534 07/10/15 0649 August 08, 2015 1947  NA 139 140 135  K 4.7 4.8 >7.5*  CL 107 108 106  CO2 21* 22 19*  GLUCOSE 125* 103* 110*  BUN 45* 37* 52*  CREATININE 1.32* 1.30* 1.82*  CALCIUM 8.8* 8.8* 8.6*  AST 69* 47*  83*  ALT 157* 123* 82*  ALKPHOS 179* 168* 122  BILITOT 1.3* 1.1 1.0   ------------------------------------------------------------------------------------------------------------------ estimated creatinine clearance is 20.2 mL/min (by C-G formula based on Cr of 1.82). ------------------------------------------------------------------------------------------------------------------ No results for input(s): HGBA1C in the last 72 hours. ------------------------------------------------------------------------------------------------------------------ No results for input(s): CHOL, HDL, LDLCALC, TRIG, CHOLHDL, LDLDIRECT in the last 72 hours. ------------------------------------------------------------------------------------------------------------------ No results for input(s): TSH, T4TOTAL, T3FREE, THYROIDAB in the last 72 hours.  Invalid input(s): FREET3 ------------------------------------------------------------------------------------------------------------------ No results for input(s): VITAMINB12, FOLATE, FERRITIN, TIBC, IRON, RETICCTPCT in the last 72 hours.  Coagulation profile No results for input(s): INR, PROTIME in the last 168 hours.  No results for input(s): DDIMER in the last 72 hours.  Cardiac Enzymes No results for input(s): CKMB, TROPONINI, MYOGLOBIN in the last 168 hours.  Invalid input(s): CK ------------------------------------------------------------------------------------------------------------------ Invalid input(s): POCBNP   CBG:  Recent Labs Lab 07/09/15 1151  GLUCAP 136*       EKG: Independently reviewed. Signs of T-wave peaking noted on EKG   Assessment/Plan Sepsis: Acute cause unknown at this time. Chest x-ray reveals a right lower lung opacity thought to be a pleural effusion but could be possible pneumonia. Patient was initially started on broad-spectrum antibiotics and pancultured. WBC 23.5, lactic acid 3.17 on admission. - Admit to  stepdown bed - Follow-up blood & sputum cultures. - Trend lactic acid level - Empiric antibiotics of vancomycin and cefepime  Nausea, vomiting, and abdominal pain - Zofran prn - Advance diet as tolerated  Hyperkalemia: Patient's initial potassium was greater than 7.5. No gross EKG changes noted. Patient was given 10 units of insulin with dextrose, 45 g of Kayexalate, and 1  g of calcium gluconate in the ED. - Recheck i-STAT now - held  potassium supplementation at this time   - Will give Lasix as tolerated will also treat with calcium gluconate and insulin   Acute on chronic combined systolic and diastolic heart failure: Given patient's recent history worsening lower extremity edema and swelling. BNP noted to be greater than 4500. Cardiology consulted and recommended gentle diuresis when able given patient's history of significant aortic stenosis. - Strict I&O - Place Foley catheter - Lasix 20 mg IV every 6 hours as tolerated - Holding patient's home Coreg - Trying to balance diuresis with patient's hypotension  Acute kidney injury on chronic kidney disease stage III: Patient's initial creatinine elevated to 1.8 from baseline of 1.3. Suspect due to patient's fluid overload status that she's poorly perfusing her kidneys. - Will try albumin with IV Lasix as patient's urine output has been low  Elevated lipase: Patient's lipase again is elevated but lower than previous at 151 previously 600 on patient last admission -Continue to monitor   Cholelithiasis: Ultrasound reveals a full gallbladder with gallstones similar to previous. Previously discussed with surgery who thought patient was not a surgical candidate secondary to significant aortic stenosis. - May consider reconsulting surgery if acute infection resolves  Hypotension: Acute. patient's blood pressure is noted to be soft for which she required initial 500 mL bolus, however with acute decompensated congestive heart failure  - As seen  above  GERD  -Protonix Code Status:   full Family Communication: bedside Disposition Plan: admit   Total time spent 55 minutes.Greater than 50% of this time was spent in counseling, explanation of diagnosis, planning of further management, and coordination of care  Clydie Braun [facility] Pager [phone]  If 7PM-7AM, please contact night-coverage [email or web address] Password [facility] 08/04/2015, 10:07 PM

## 2015-07-15 NOTE — ED Notes (Signed)
Notified Campos, MD of Lactic Acid of 3.17

## 2015-07-15 NOTE — ED Notes (Signed)
Patient gone to ultrasound  

## 2015-07-15 NOTE — ED Notes (Signed)
Reported results to Dr. Connye BurkittJenifer I. MD acknowledges. Awaiting new orders.

## 2015-07-15 NOTE — Progress Notes (Signed)
Pharmacy Antibiotic Note  Rebecca Woodard is a 82 y.o. female admitted on 07/27/2015 with sepsis.  Pharmacy has been consulted for vancomycin and zosyn dosing.  Plan: Vancomycin 1g IV every 24 hours.  Goal trough 15-20 mcg/mL. Zosyn 3.375g IV q8h (4 hour infusion).  Monitor culture data, renal function and clinical course VT at SS prn     Temp (24hrs), Avg:98.2 F (36.8 C), Min:97.5 F (36.4 C), Max:98.9 F (37.2 C)   Recent Labs Lab 07/09/15 0534 07/10/15 0649 07/08/2015 1947  WBC  --   --  23.5*  CREATININE 1.32* 1.30* 1.82*    Estimated Creatinine Clearance: 20.2 mL/min (by C-G formula based on Cr of 1.82).    Allergies  Allergen Reactions  . Amoxicillin Other (See Comments)    Intolerance per son  . Salicylates Other (See Comments)    All nsaids - unknown reaction    Antimicrobials this admission: Vanc 3/16 >>  Zosyn 3/16 >>   Dose adjustments this admission: n/a  Arlean HoppingCorey M. Newman PiesBall, PharmD, BCPS Clinical Pharmacist Pager [phone] 07/29/2015 10:56 PM

## 2015-07-15 NOTE — ED Notes (Signed)
Dr. Smith, hospitalist, at the bedside.  

## 2015-07-15 NOTE — ED Notes (Signed)
MD at the bedside along with phlebotomy, attempting to obtain blood.

## 2015-07-15 NOTE — ED Notes (Signed)
Blood sugar is 102.

## 2015-07-15 NOTE — ED Provider Notes (Signed)
CSN: [id number]     Arrival date & time 08/06/2015  1648 History   First MD Initiated Contact with Patient 08-06-2015 1745     Chief Complaint  Patient presents with  . Shortness of Breath      HPI  82 year old female with history of severe aortic stenosis, CHF with LVEF of 45-50%, hypertension, recent admission to the hospital with discharge on 3/12 for gallstone pancreatitis. During that recent hospital admission the patient underwent ERCP but her gallbladder was not removed secondary to concern that she would not survive the surgery because of her severe aortic stenosis. She presents today with nausea, 7-8 episodes of vomiting daily, shortness of breath, and generalized weakness. Patient's family, who accompanies her, report that her symptoms have been steadily worsening since she was discharged. Additionally she reports worsening right upper quadrant abdominal pain. Denies chest pain. Denies fevers though endorses chills. Shortness of breath is worse with exertion. No personal history of coronary artery disease.   Past Medical History  Diagnosis Date  . Acid reflux   . Gallstones   . Heart murmur   . Inner ear disease   . HTN (hypertension) 01/09/2015  . GERD (gastroesophageal reflux disease) 01/09/2015  . Hyperlipidemia 01/09/2015  . History of gallstones 01/09/2015  . Anemia 01/09/2015   Past Surgical History  Procedure Laterality Date  . Tonsilectomy, adenoidectomy, bilateral myringotomy and tubes Bilateral   . Appendectomy Bilateral   . Ercp [name] 07/09/2015    Procedure: ENDOSCOPIC RETROGRADE CHOLANGIOPANCREATOGRAPHY (ERCP);  Surgeon: Dorena Cookey, MD;  Location: [facility] ENDOSCOPY;  Service: Endoscopy;  Laterality: [name];   Family History  Problem Relation Age of Onset  . Heart failure Mother   . Dementia Father   . Heart attack Father    Social History  Substance Use Topics  . Smoking status: Never Smoker   . Smokeless tobacco: None  . Alcohol Use: No   OB History    No data  available     Review of Systems  Constitutional: Positive for chills and appetite change. Negative for fever and activity change.  HENT: Negative for congestion, rhinorrhea and sore throat.   Eyes: Negative for visual disturbance.  Respiratory: Positive for shortness of breath. Negative for cough and wheezing.   Cardiovascular: Negative for chest pain and palpitations.  Gastrointestinal: Positive for nausea, vomiting and abdominal pain. Negative for diarrhea, constipation, blood in stool and abdominal distention.  Genitourinary: Negative for dysuria, frequency and flank pain.  Musculoskeletal: Negative for myalgias, back pain, joint swelling, arthralgias, gait problem, neck pain and neck stiffness.  Skin: Negative for rash.  Neurological: Positive for weakness (generalized). Negative for dizziness, tremors, syncope, facial asymmetry, speech difficulty, numbness and headaches.  Psychiatric/Behavioral: Negative for behavioral problems, confusion and agitation.      Allergies  Amoxicillin and Salicylates  Home Medications   Prior to Admission medications   Medication Sig Start Date End Date Taking? Authorizing Provider  aspirin EC 81 MG EC tablet Take 1 tablet (81 mg total) by mouth daily. 01/11/15   Leroy Sea, MD  carvedilol (COREG) 3.125 MG tablet Take 1 tablet (3.125 mg total) by mouth 2 (two) times daily with a meal. 01/11/15   Leroy Sea, MD  furosemide (LASIX) 40 MG tablet TAKE 1 TABLET (40 MG TOTAL) BY MOUTH DAILY. Patient taking differently: TAKE 1 TABLET (40 MG TOTAL) BY MOUTH DAILY, MAY TAKE ANOTHER TABLET DURING THE DAY IF NEEDED FOR SWELLING/ SHORTNESS OF BREATH 06/16/15   Azalee Course, PA  KLOR-CON M20 20 MEQ tablet TAKE 2 TABLETS (40 MEQ TOTAL) BY MOUTH DAILY. TAKE WITH LASIX TO REPLACE POTASSIUM Patient taking differently: TAKE 1 TABLET BY MOUTH TWICE DAILY. TAKE WITH LASIX TO REPLACE POTASSIUM 06/16/15   Azalee Course, PA  omeprazole (PRILOSEC) 20 MG capsule Take 20 mg by  mouth 2 (two) times daily. 06/16/15   Historical Provider, MD  ondansetron (ZOFRAN ODT) 4 MG disintegrating tablet Take 1 tablet (4 mg total) by mouth every 8 (eight) hours as needed for nausea or vomiting. 07/11/15   Maretta Bees, MD  OVER THE COUNTER MEDICATION Over the counter motion sickness pill - take 1 tablet by mouth every morning and may also take 1 or 2 tablets during the day as needed for nausea    Historical Provider, MD  oxyCODONE (ROXICODONE) 5 MG immediate release tablet Take 1 tablet (5 mg total) by mouth every 6 (six) hours as needed for severe pain. 07/11/15   Shanker Levora Dredge, MD  simethicone (MYLICON) 80 MG chewable tablet Chew 80 mg by mouth every 6 (six) hours as needed for flatulence.    Historical Provider, MD   BP 91/40 mmHg  Pulse 75  Temp(Src) 97.5 F (36.4 C) (Axillary)  Resp 26  SpO2 97% Physical Exam  Constitutional: She is oriented to person, place, and time. She appears well-developed and well-nourished. No distress.  HENT:  Head: Normocephalic and atraumatic.  Right Ear: External ear normal.  Left Ear: External ear normal.  Nose: Nose normal.  Mouth/Throat: Oropharynx is clear and moist. No oropharyngeal exudate.  Eyes: Conjunctivae and EOM are normal. Pupils are equal, round, and reactive to light. Right eye exhibits no discharge. Left eye exhibits no discharge.  Neck: Normal range of motion. Neck supple.  Cardiovascular: Normal rate and regular rhythm.  Exam reveals no gallop and no friction rub.   Murmur (IV/VI systolic murmur) heard. Pulmonary/Chest:  Increased WOB, decreased breath sounds to the RLL  Abdominal: Soft. Bowel sounds are normal. She exhibits no distension and no mass. There is tenderness (RUQ). There is no rebound and no guarding.  Musculoskeletal: Normal range of motion. She exhibits tenderness (3+ pitting edema to the bilateral LEs). She exhibits no edema.  Neurological: She is alert and oriented to person, place, and time.  Skin:  Skin is warm and dry. No rash noted. She is not diaphoretic.  Psychiatric: She has a normal mood and affect. Her behavior is normal. Judgment and thought content normal.    ED Course  Procedures (including critical care time) Labs Review Labs Reviewed  CULTURE, BLOOD (ROUTINE X 2)  CULTURE, BLOOD (ROUTINE X 2)  URINE CULTURE  LIPASE, BLOOD  COMPREHENSIVE METABOLIC PANEL  CBC  URINALYSIS, ROUTINE W REFLEX MICROSCOPIC (NOT AT [facility])  TROPONIN I  BRAIN NATRIURETIC PEPTIDE  I-STAT TROPOININ, ED  I-STAT CG4 LACTIC ACID, ED    Imaging Review Dg Chest 2 View  Jul 28, 2015  CLINICAL DATA:  Shortness of breath with fatigue today. History of hypertension. EXAM: CHEST  2 VIEW COMPARISON:  Chest x-rays dated 07/05/2015 and 02/24/2015. FINDINGS: Increasing opacity within the right lower lung. No significant change on the left, with probable stable atelectasis and/or small pleural effusion. Cardiomegaly is again noted. Overall cardiomediastinal silhouette appears stable in size and configuration. Osseous structures about the chest are unremarkable. Mild degenerative change and ankylosis throughout the kyphotic thoracic spine. IMPRESSION: 1. Increasing opacity within the right lower lung, most likely an increasing pleural effusion combined with atelectasis. If febrile, an underlying  pneumonia could not be excluded. 2. Stable appearance of the left lung with probable stable atelectasis and/or small effusion. 3. Stable cardiomegaly. Electronically Signed   By: Bary RichardStan  Maynard M.D.   On: 2015/09/16 17:54   I have personally reviewed and evaluated these images and lab results as part of my medical decision-making.   EKG Interpretation   Date/Time:  Thursday July 15 2015 16:55:49 EDT Ventricular Rate:  87 PR Interval:  224 QRS Duration: 126 QT Interval:  380 QTC Calculation: 457 R Axis:   -39 Text Interpretation:  Sinus rhythm with 1st degree A-V block Left axis  deviation Left ventricular hypertrophy  with QRS widening Cannot rule out  Septal infarct , age undetermined T wave abnormality, consider lateral  ischemia Abnormal ECG Confirmed by DELO  MD, DOUGLAS ([id number]) on 01-25-16  5:08:20 PM      MDM   Final diagnoses:  Abdominal pain    On arrival the patient is hypotensive with blood pressure of 91/40. She appears dyspneic. EKG is not concerning for acute ischemic changes. Chest x-ray reveals an increased opacity within the right lower lung, likely a pleural effusion though unable to exclude an underlying pneumonia. Patient is afebrile but has a leukocytosis to 23.5. Vancomycin and zosyn started for empiric treatment of likely HCAP. Blood and urine cultures sent. As she continues to have right upper quadrant pain, a right upper quadrant ultrasound was performed that reveals thickening of the gallbladder [place] to 7 mm, though acute cholecystitis is less likely given lack of positive sonographic murphy's sign. Lactic acid is elevated to 3.17. BNP greater than 4500. She has new onset lower extremity edema. Suspect new onset CHF likely secondary to her severe aortic stenosis. Will not administer full 30 mL/kg of IV fluids at this time for fear of causing respiratory distress. Initial labs reveal a potassium greater than 7.5. Temporizing measures including calcium gluconate, insulin, and Kayexalate were administered with improvement of potassium to 6.8. Patient also has an AKI with creatinine of 1.8 and BUN elevated to 53. Lipase is 153 which is greatly improved from last value prior to patient's discharge. Troponin elevated to 0.3, though I suspect that this is in the setting of myocardial demand from the pt's CHF and sepsis.   Plan to admit to the stepdown unit for management of the pt's multiple acute medical conditions.    [place] Rico Ernestina PennaBrunno Tymara Saur, MD 07/11/2015 [id number]  Azalia BilisKevin Campos, MD 07/25/2015 [id number]  Azalia BilisKevin Campos, MD 07/08/2015 [phone]

## 2015-07-15 NOTE — ED Notes (Signed)
Pt here for SOB and emesis, symptoms started "ever since she got out of the hospital on March 12" She was admitted on March 6 for similar symptoms. Emesis x 7 or 8 times today.

## 2015-07-15 NOTE — ED Notes (Signed)
Received lab result of K+ greater than 7, not hemolyzed.

## 2015-07-16 DIAGNOSIS — I248 Other forms of acute ischemic heart disease: Secondary | ICD-10-CM | POA: Diagnosis present

## 2015-07-16 DIAGNOSIS — I38 Endocarditis, valve unspecified: Secondary | ICD-10-CM

## 2015-07-16 DIAGNOSIS — J9601 Acute respiratory failure with hypoxia: Secondary | ICD-10-CM

## 2015-07-16 DIAGNOSIS — I350 Nonrheumatic aortic (valve) stenosis: Secondary | ICD-10-CM

## 2015-07-16 DIAGNOSIS — R112 Nausea with vomiting, unspecified: Secondary | ICD-10-CM | POA: Diagnosis present

## 2015-07-16 DIAGNOSIS — I5033 Acute on chronic diastolic (congestive) heart failure: Secondary | ICD-10-CM

## 2015-07-16 DIAGNOSIS — E872 Acidosis: Secondary | ICD-10-CM

## 2015-07-16 DIAGNOSIS — K859 Acute pancreatitis without necrosis or infection, unspecified: Secondary | ICD-10-CM | POA: Insufficient documentation

## 2015-07-16 DIAGNOSIS — E875 Hyperkalemia: Secondary | ICD-10-CM | POA: Diagnosis present

## 2015-07-16 DIAGNOSIS — A419 Sepsis, unspecified organism: Principal | ICD-10-CM

## 2015-07-16 DIAGNOSIS — N179 Acute kidney failure, unspecified: Secondary | ICD-10-CM

## 2015-07-16 LAB — COMPREHENSIVE METABOLIC PANEL
ALBUMIN: 3.2 g/dL — AB (ref 3.5–5.0)
ALK PHOS: 124 U/L (ref 38–126)
ALK PHOS: 128 U/L — AB (ref 38–126)
ALT: 93 U/L — ABNORMAL HIGH (ref 14–54)
ALT: 130 U/L — ABNORMAL HIGH (ref 14–54)
ANION GAP: 11 (ref 5–15)
ANION GAP: 11 (ref 5–15)
AST: 110 U/L — ABNORMAL HIGH (ref 15–41)
AST: 194 U/L — ABNORMAL HIGH (ref 15–41)
Albumin: 3.2 g/dL — ABNORMAL LOW (ref 3.5–5.0)
BILIRUBIN TOTAL: 1.6 mg/dL — AB (ref 0.3–1.2)
BUN: 56 mg/dL — AB (ref 6–20)
BUN: 60 mg/dL — ABNORMAL HIGH (ref 6–20)
CALCIUM: 9.5 mg/dL (ref 8.9–10.3)
CALCIUM: 9.5 mg/dL (ref 8.9–10.3)
CO2: 21 mmol/L — ABNORMAL LOW (ref 22–32)
CO2: 20 mmol/L — AB (ref 22–32)
CREATININE: 2.44 mg/dL — AB (ref 0.44–1.00)
Chloride: 103 mmol/L (ref 101–111)
Chloride: 106 mmol/L (ref 101–111)
Creatinine, Ser: 1.98 mg/dL — ABNORMAL HIGH (ref 0.44–1.00)
GFR calc Af Amer: 24 mL/min — ABNORMAL LOW (ref >60)
GFR calc Af Amer: 19 mL/min — ABNORMAL LOW (ref >60)
GFR calc non Af Amer: 16 mL/min — ABNORMAL LOW (ref >60)
GFR, EST NON AFRICAN AMERICAN: 21 mL/min — AB (ref >60)
Glucose, Bld: 106 mg/dL — ABNORMAL HIGH (ref 65–99)
Glucose, Bld: 151 mg/dL — ABNORMAL HIGH (ref 65–99)
SODIUM: 137 mmol/L (ref 135–145)
Sodium: 135 mmol/L (ref 135–145)
TOTAL PROTEIN: 6.2 g/dL — AB (ref 6.5–8.1)
TOTAL PROTEIN: 5.8 g/dL — AB (ref 6.5–8.1)
Total Bilirubin: 1.5 mg/dL — ABNORMAL HIGH (ref 0.3–1.2)

## 2015-07-16 LAB — I-STAT CHEM 8, ED
BUN: 53 mg/dL — ABNORMAL HIGH (ref 6–20)
CREATININE: 1.8 mg/dL — AB (ref 0.44–1.00)
Calcium, Ion: 1.29 mmol/L (ref 1.13–1.30)
Chloride: 106 mmol/L (ref 101–111)
Glucose, Bld: 127 mg/dL — ABNORMAL HIGH (ref 65–99)
HEMATOCRIT: 39 % (ref 36.0–46.0)
HEMOGLOBIN: 13.3 g/dL (ref 12.0–15.0)
POTASSIUM: 6.8 mmol/L — AB (ref 3.5–5.1)
Sodium: 135 mmol/L (ref 135–145)
TCO2: 19 mmol/L (ref 0–100)

## 2015-07-16 LAB — CBC
HEMATOCRIT: 34.7 % — AB (ref 36.0–46.0)
Hemoglobin: 10.7 g/dL — ABNORMAL LOW (ref 12.0–15.0)
MCH: 27.6 pg (ref 26.0–34.0)
MCHC: 30.8 g/dL (ref 30.0–36.0)
MCV: 89.4 fL (ref 78.0–100.0)
Platelets: 168 10*3/uL (ref 150–400)
RBC: 3.88 MIL/uL (ref 3.87–5.11)
RDW: 18.5 % — AB (ref 11.5–15.5)
WBC: 25.1 10*3/uL — ABNORMAL HIGH (ref 4.0–10.5)

## 2015-07-16 LAB — CBC WITH DIFFERENTIAL/PLATELET
BASOS ABS: 0 10*3/uL (ref 0.0–0.1)
BASOS PCT: 0 %
EOS ABS: 0 10*3/uL (ref 0.0–0.7)
Eosinophils Relative: 0 %
HCT: 33.7 % — ABNORMAL LOW (ref 36.0–46.0)
HEMOGLOBIN: 10.2 g/dL — AB (ref 12.0–15.0)
LYMPHS PCT: 35 %
Lymphs Abs: 9 10*3/uL — ABNORMAL HIGH (ref 0.7–4.0)
MCH: 27.3 pg (ref 26.0–34.0)
MCHC: 30.3 g/dL (ref 30.0–36.0)
MCV: 90.1 fL (ref 78.0–100.0)
MONO ABS: 1.3 10*3/uL — AB (ref 0.1–1.0)
Monocytes Relative: 5 %
NEUTROS PCT: 60 %
Neutro Abs: 15.5 10*3/uL — ABNORMAL HIGH (ref 1.7–7.7)
PLATELETS: 129 10*3/uL — AB (ref 150–400)
RBC: 3.74 MIL/uL — ABNORMAL LOW (ref 3.87–5.11)
RDW: 18.5 % — ABNORMAL HIGH (ref 11.5–15.5)
WBC: 25.8 10*3/uL — AB (ref 4.0–10.5)

## 2015-07-16 LAB — LACTIC ACID, PLASMA: LACTIC ACID, VENOUS: 3.4 mmol/L — AB (ref 0.5–2.0)

## 2015-07-16 LAB — I-STAT CG4 LACTIC ACID, ED: Lactic Acid, Venous: 2.06 mmol/L (ref 0.5–2.0)

## 2015-07-16 LAB — CBG MONITORING, ED: GLUCOSE-CAPILLARY: 102 mg/dL — AB (ref 65–99)

## 2015-07-16 LAB — TROPONIN I
Troponin I: 0.36 ng/mL — ABNORMAL HIGH (ref <0.031)
Troponin I: 0.39 ng/mL — ABNORMAL HIGH (ref <0.031)

## 2015-07-16 MED ORDER — LEVALBUTEROL HCL 0.63 MG/3ML IN NEBU
0.6300 mg | INHALATION_SOLUTION | Freq: Four times a day (QID) | RESPIRATORY_TRACT | Status: DC
  Administered 2015-07-16 (×3): 0.63 mg via RESPIRATORY_TRACT
  Filled 2015-07-16 (×5): qty 3

## 2015-07-16 MED ORDER — ALBUMIN HUMAN 25 % IV SOLN
12.5000 g | Freq: Once | INTRAVENOUS | Status: AC
  Administered 2015-07-16: 12.5 g via INTRAVENOUS
  Filled 2015-07-16: qty 50

## 2015-07-16 MED ORDER — SCOPOLAMINE 1 MG/3DAYS TD PT72
1.0000 | MEDICATED_PATCH | TRANSDERMAL | Status: DC
  Administered 2015-07-16: 1.5 mg via TRANSDERMAL
  Filled 2015-07-16: qty 1

## 2015-07-16 MED ORDER — INSULIN ASPART 100 UNIT/ML IV SOLN
10.0000 [IU] | Freq: Once | INTRAVENOUS | Status: AC
  Administered 2015-07-16: 10 [IU] via INTRAVENOUS
  Filled 2015-07-16: qty 1

## 2015-07-16 MED ORDER — LEVOFLOXACIN IN D5W 750 MG/150ML IV SOLN: 750.0000 mg | Freq: Once | INTRAVENOUS | Status: DC

## 2015-07-16 MED ORDER — VANCOMYCIN HCL IN DEXTROSE 1-5 GM/200ML-% IV SOLN
1000.0000 mg | INTRAVENOUS | Status: DC
  Filled 2015-07-16: qty 200

## 2015-07-16 MED ORDER — LEVALBUTEROL HCL 0.63 MG/3ML IN NEBU
0.6300 mg | INHALATION_SOLUTION | RESPIRATORY_TRACT | Status: DC | PRN
  Filled 2015-07-16: qty 3

## 2015-07-16 MED ORDER — MORPHINE SULFATE (PF) 2 MG/ML IV SOLN
1.0000 mg | INTRAVENOUS | Status: DC | PRN
  Administered 2015-07-16 (×2): 1 mg via INTRAVENOUS
  Filled 2015-07-16 (×2): qty 1

## 2015-07-16 MED ORDER — DEXTROSE 5 % IV SOLN
1.0000 g | INTRAVENOUS | Status: DC
  Administered 2015-07-16: 1 g via INTRAVENOUS
  Filled 2015-07-16 (×2): qty 1

## 2015-07-16 MED ORDER — FUROSEMIDE 10 MG/ML IJ SOLN: 20.0000 mg | Freq: Four times a day (QID) | INTRAMUSCULAR | Status: DC

## 2015-07-16 MED ORDER — HEPARIN SODIUM (PORCINE) 5000 UNIT/ML IJ SOLN
5000.0000 [IU] | Freq: Three times a day (TID) | INTRAMUSCULAR | Status: DC
  Administered 2015-07-16 (×3): 5000 [IU] via SUBCUTANEOUS
  Filled 2015-07-16 (×3): qty 1

## 2015-07-16 MED ORDER — SODIUM POLYSTYRENE SULFONATE 15 GM/60ML PO SUSP
30.0000 g | Freq: Once | ORAL | Status: DC
  Filled 2015-07-16: qty 120

## 2015-07-16 MED ORDER — ACETAMINOPHEN 325 MG PO TABS
650.0000 mg | ORAL_TABLET | ORAL | Status: DC | PRN
  Administered 2015-07-16: 650 mg via ORAL
  Filled 2015-07-16: qty 2

## 2015-07-16 MED ORDER — IPRATROPIUM BROMIDE 0.02 % IN SOLN
0.5000 mg | Freq: Four times a day (QID) | RESPIRATORY_TRACT | Status: DC
  Administered 2015-07-16 (×3): 0.5 mg via RESPIRATORY_TRACT
  Filled 2015-07-16 (×5): qty 2.5

## 2015-07-16 MED ORDER — ONDANSETRON HCL 4 MG/2ML IJ SOLN
4.0000 mg | Freq: Four times a day (QID) | INTRAMUSCULAR | Status: DC | PRN
  Administered 2015-07-16 (×2): 4 mg via INTRAVENOUS
  Filled 2015-07-16 (×2): qty 2

## 2015-07-16 MED ORDER — SODIUM CHLORIDE 0.9 % IV SOLN
1.0000 g | Freq: Once | INTRAVENOUS | Status: AC
  Administered 2015-07-16: 1 g via INTRAVENOUS
  Filled 2015-07-16: qty 10

## 2015-07-16 MED ORDER — PANTOPRAZOLE SODIUM 40 MG IV SOLR: 40.0000 mg | INTRAVENOUS | Status: DC

## 2015-07-16 MED ORDER — LORAZEPAM 0.5 MG PO TABS
0.5000 mg | ORAL_TABLET | Freq: Four times a day (QID) | ORAL | Status: DC | PRN
  Administered 2015-07-16: 0.5 mg via ORAL
  Filled 2015-07-16: qty 1

## 2015-07-16 MED ORDER — ENOXAPARIN SODIUM 30 MG/0.3ML ~~LOC~~ SOLN: 30.0000 mg | SUBCUTANEOUS | Status: DC

## 2015-07-16 MED ORDER — DEXTROSE 5 % IV SOLN: 2.0000 g | Freq: Once | INTRAVENOUS | Status: DC

## 2015-07-16 MED ORDER — SODIUM CHLORIDE 0.9 % IV SOLN: 250.0000 mL | INTRAVENOUS | Status: DC | PRN

## 2015-07-16 MED ORDER — VANCOMYCIN HCL IN DEXTROSE 1-5 GM/200ML-% IV SOLN: 1000.0000 mg | Freq: Once | INTRAVENOUS | Status: DC

## 2015-07-16 MED ORDER — MORPHINE SULFATE (PF) 2 MG/ML IV SOLN: 2.0000 mg | INTRAVENOUS | Status: DC | PRN

## 2015-07-16 MED ORDER — SODIUM CHLORIDE 0.9% FLUSH
3.0000 mL | Freq: Two times a day (BID) | INTRAVENOUS | Status: DC
  Administered 2015-07-16 (×2): 3 mL via INTRAVENOUS

## 2015-07-16 MED ORDER — OXYCODONE-ACETAMINOPHEN 5-325 MG PO TABS
1.0000 | ORAL_TABLET | ORAL | Status: DC | PRN
  Administered 2015-07-16: 2 via ORAL
  Filled 2015-07-16: qty 2

## 2015-07-16 MED ORDER — ASPIRIN EC 81 MG PO TBEC
81.0000 mg | DELAYED_RELEASE_TABLET | Freq: Every day | ORAL | Status: DC
  Administered 2015-07-16: 81 mg via ORAL
  Filled 2015-07-16: qty 1

## 2015-07-16 MED ORDER — PANTOPRAZOLE SODIUM 40 MG PO TBEC
40.0000 mg | DELAYED_RELEASE_TABLET | Freq: Every day | ORAL | Status: DC
  Administered 2015-07-16: 40 mg via ORAL
  Filled 2015-07-16: qty 1

## 2015-07-16 MED ORDER — ONDANSETRON HCL 4 MG PO TABS: 4.0000 mg | ORAL_TABLET | Freq: Four times a day (QID) | ORAL | Status: DC | PRN

## 2015-07-16 MED ORDER — LORAZEPAM 2 MG/ML IJ SOLN: 0.5000 mg | Freq: Four times a day (QID) | INTRAMUSCULAR | Status: DC | PRN

## 2015-07-16 MED ORDER — SODIUM CHLORIDE 0.9% FLUSH
3.0000 mL | Freq: Two times a day (BID) | INTRAVENOUS | Status: DC
  Administered 2015-07-16: 3 mL via INTRAVENOUS

## 2015-07-16 MED ORDER — SIMETHICONE 80 MG PO CHEW
80.0000 mg | CHEWABLE_TABLET | Freq: Four times a day (QID) | ORAL | Status: DC | PRN
  Filled 2015-07-16: qty 1

## 2015-07-16 MED ORDER — PIPERACILLIN-TAZOBACTAM 3.375 G IVPB: 3.3750 g | Freq: Three times a day (TID) | INTRAVENOUS | Status: DC

## 2015-07-16 MED ORDER — FUROSEMIDE 10 MG/ML IJ SOLN
20.0000 mg | Freq: Four times a day (QID) | INTRAMUSCULAR | Status: DC
  Administered 2015-07-16 (×3): 20 mg via INTRAVENOUS
  Filled 2015-07-16 (×4): qty 2

## 2015-07-16 MED ORDER — SODIUM POLYSTYRENE SULFONATE 15 GM/60ML PO SUSP
45.0000 g | Freq: Once | ORAL | Status: DC
  Filled 2015-07-16: qty 180

## 2015-07-16 MED ORDER — SODIUM CHLORIDE 0.9% FLUSH: 3.0000 mL | INTRAVENOUS | Status: DC | PRN

## 2015-07-16 MED ORDER — DEXTROSE 50 % IV SOLN
1.0000 | Freq: Once | INTRAVENOUS | Status: AC
  Administered 2015-07-16: 50 mL via INTRAVENOUS
  Filled 2015-07-16: qty 50

## 2015-07-17 LAB — URINE CULTURE

## 2015-07-19 LAB — PATHOLOGIST SMEAR REVIEW

## 2015-07-20 LAB — CULTURE, BLOOD (ROUTINE X 2): Culture: NO GROWTH

## 2015-07-21 LAB — CULTURE, BLOOD (ROUTINE X 2): Culture: NO GROWTH

## 2015-07-31 NOTE — Progress Notes (Signed)
Called to bedside -patient becoming more hypoxic and hypotensive. Tenuous IV site due to anasarca. Minimal urine output. Patient is unresponsive to all but painful stimuli. Unable to take any meds by oral route.  I spoke with family who had already been informed by Critical Care regarding her poor prognosis. They are not interested in central venous access if peripheral IV fails. Due to worsening fluid overload and evidence of ongoing sepsis in spite of broad spectrum antibiotics they would like to discontinue the antibiotics and avoid aggressive fluid resuscitation. Both of her daughters (next of kin) agree that at this point they want to concentrate on comfort care. Other family members at bedside (grandchildren) also in agreement.  Have discontinued oral medications and IV antibiotics. She has IV and subq Morphine for pain/air hunger, Ativan for agitation/anxiety and have placed Scopolamine patch for secretions.

## 2015-07-31 NOTE — Progress Notes (Signed)
Lab attempted draw unable to get sufficient quantity for blood tests ordered. Will send another lab tech. Pt is edematous.

## 2015-07-31 NOTE — ED Notes (Signed)
Discussed plan of care with dr. Katrinka BlazingSmith, bp trending down. Planning for foley placement and administer lasix in split doses.

## 2015-07-31 NOTE — Progress Notes (Signed)
Pt c/o leg cramps - medicated for pain with 2 tylenol PO. Took pills well with water no coughing or choking 5 family members at bedside. Worried about pt's feet being cool to touch. Per family & pt they wanted heat for feet- given warm packs from the floor stock and towels to gently place on bottom of pt's feet.

## 2015-07-31 NOTE — ED Notes (Signed)
Called pharmacy, vanc dose already given. Next dose to be rescheduled.

## 2015-07-31 NOTE — Consult Note (Signed)
Name: Rebecca Woodard MRN: [id number] DOB: [birth year]    ADMISSION DATE:  07/07/2015 CONSULTATION DATE:  08/03/2015  REFERRING MD :  Dr. Doyle Askew   CHIEF COMPLAINT:  Family request for second opinion    HISTORY OF PRESENT ILLNESS:  82 y/o F with PMH of GERD, gallstones, HTN, HLD, HTN, HLD, severe aortic stenosis, combined systolic & diastolic CHF ([id number]% 09/7339) who was initially admitted 3/16 with vomiting and shortness of breath.    The patient was recently admitted 3/6-3/12 for acute gallstone pancreatitis requiring ERCP on 3/10.  Surgery was deferred in the absence of no cholecystitis and concerns with critical aortic stenosis.    Family reported on presentation that she had decreased appetite, abdominal pain and intermittent vomiting.  On the day of presentation, she had 7-8 episodes of vomiting, chills, weakness, increased RUQ pain, swelling and worsening SOB.  The patient has been non-ambulatory since the 3/6 admission.  Prior to that admit, she was reportedly ambulatory.  Admit labs - Na 135, K >7.5, sr cr 1.82 (up from 1.30), glucose 110, albumin 3.1, alk phos 122, AST 83, ALT 82, total bilirubin 1.0, troponin 0.30, BNP > 4500, lactic acid 3.17, WBC 23.5, Hgb 10.5, and platelets 147.  CXR concerning for increasing right pleural effusion and atelectasis.  The patient was treated for hyperkalemia in the ER.  She was admitted for presumed sepsis (likely biliary source with recent instrumentation, +/- PNA) and covered with empiric antibiotics for cholecystitis / PNA coverage (vanco / cefepime).     Follow up labs 3/17 - Na 135, K >7.5, sr cr increased to 1.98, alk phos 124, AST 110, ALT 93, total bili 1.6 (rising, previously normal), lactic acid 2.06, troponin 0.36, WBC increased to 25.1, Hgb 10.7, and platelets 168.  She was evaluated by Cardiology and supportive care was recommended, beta blocker was held in the setting of hypotension and recommendations for goals of care discussion.  Findings  were appropriately discussed with family per primary MD and family requested a second opinion.    PCCM called for evaluation.   PAST MEDICAL HISTORY :   has a past medical history of Acid reflux; Gallstones; Heart murmur; Inner ear disease; HTN (hypertension) (01/09/2015); GERD (gastroesophageal reflux disease) (01/09/2015); Hyperlipidemia (01/09/2015); History of gallstones (01/09/2015); and Anemia (01/09/2015).  has past surgical history that includes Tonsilectomy, adenoidectomy, bilateral myringotomy and tubes (Bilateral); appendectomy (Bilateral); and ERCP ([name], 07/09/2015).   Prior to Admission medications   Medication Sig Start Date End Date Taking? Authorizing Provider  aspirin EC 81 MG EC tablet Take 1 tablet (81 mg total) by mouth daily. 01/11/15  Yes Thurnell Lose, MD  carvedilol (COREG) 3.125 MG tablet Take 1 tablet (3.125 mg total) by mouth 2 (two) times daily with a meal. 01/11/15  Yes Thurnell Lose, MD  furosemide (LASIX) 40 MG tablet TAKE 1 TABLET (40 MG TOTAL) BY MOUTH DAILY. Patient taking differently: TAKE 1 TABLET (40 MG TOTAL) BY MOUTH DAILY, MAY TAKE ANOTHER TABLET DURING THE DAY IF NEEDED FOR SWELLING/ SHORTNESS OF BREATH 06/16/15  Yes Hao Meng, PA  KLOR-CON M20 20 MEQ tablet TAKE 2 TABLETS (40 MEQ TOTAL) BY MOUTH DAILY. TAKE WITH LASIX TO REPLACE POTASSIUM Patient taking differently: TAKE 1 TABLET BY MOUTH TWICE DAILY. TAKE WITH LASIX TO REPLACE POTASSIUM 06/16/15  Yes Almyra Deforest, PA  omeprazole (PRILOSEC) 20 MG capsule Take 20 mg by mouth 2 (two) times daily. 06/16/15  Yes Historical Provider, MD  ondansetron (ZOFRAN ODT) 4 MG disintegrating  tablet Take 1 tablet (4 mg total) by mouth every 8 (eight) hours as needed for nausea or vomiting. 07/11/15  Yes Shanker Kristeen Mans, MD  OVER THE COUNTER MEDICATION Over the counter motion sickness pill - take 1 tablet by mouth every morning and may also take 1 or 2 tablets during the day as needed for nausea   Yes Historical Provider, MD   simethicone (MYLICON) 80 MG chewable tablet Chew 80 mg by mouth every 6 (six) hours as needed for flatulence.   Yes Historical Provider, MD  oxyCODONE (ROXICODONE) 5 MG immediate release tablet Take 1 tablet (5 mg total) by mouth every 6 (six) hours as needed for severe pain. Patient not taking: Reported on 07/21/2015 07/11/15   Jonetta Osgood, MD   Allergies  Allergen Reactions  . Amoxicillin Other (See Comments)    Intolerance per son  . Salicylates Other (See Comments)    All nsaids - unknown reaction    FAMILY HISTORY:  family history includes Dementia in her father; Heart attack in her father; Heart failure in her mother.   SOCIAL HISTORY:  reports that she has never smoked. She does not have any smokeless tobacco history on file. She reports that she does not drink alcohol or use illicit drugs.  REVIEW OF SYSTEMS:   Unable to complete with patient due to confusion   SUBJECTIVE:   VITAL SIGNS: Temp:  [96.7 F (35.9 C)-98.9 F (37.2 C)] 97.5 F (36.4 C) (03/17 1200) Pulse Rate:  [31-94] 31 (03/17 0900) Resp:  [12-30] 30 (03/17 1200) BP: (89-118)/(40-89) 118/61 mmHg (03/17 1200) SpO2:  [66 %-100 %] 77 % (03/17 1200) Weight:  [184 lb (83.462 kg)-190 lb 7.6 oz (86.4 kg)] 184 lb (83.462 kg) (03/17 0800)  PHYSICAL EXAMINATION: General:  Frail elderly female in NAD Neuro:  AAOx4, speech clear, MAE  HEENT:  MM pink/moist, no jvd Cardiovascular:  s1s2 rrr, AS murmur Lungs:  Even/non-labored, clear anterior, crackles lower later R>L, diminished on R Abdomen:  Soft, bsx4 active, pt currently denies tenderness with palpation  Musculoskeletal:  No acute deformities  Skin:  Warm/dry, BLE mottled, cool   Recent Labs Lab 07/10/15 0649 07/21/2015 1947 07/25/2015 2359 04-Aug-2015 0323  NA 140 135 135 135  K 4.8 >7.5* 6.8* >7.5*  CL 108 106 106 103  CO2 22 19*  --  21*  BUN 37* 52* 53* 56*  CREATININE 1.30* 1.82* 1.80* 1.98*  GLUCOSE 103* 110* 127* 106*    Recent Labs Lab  07/26/2015 1947 07/05/2015 2359 04-Aug-2015 0323  HGB 10.6* 13.3 10.7*  HCT 35.2* 39.0 34.7*  WBC 23.5*  --  25.1*  PLT 147*  --  168   Dg Chest 2 View  07/21/2015  CLINICAL DATA:  Shortness of breath with fatigue today. History of hypertension. EXAM: CHEST  2 VIEW COMPARISON:  Chest x-rays dated 07/05/2015 and 02/24/2015. FINDINGS: Increasing opacity within the right lower lung. No significant change on the left, with probable stable atelectasis and/or small pleural effusion. Cardiomegaly is again noted. Overall cardiomediastinal silhouette appears stable in size and configuration. Osseous structures about the chest are unremarkable. Mild degenerative change and ankylosis throughout the kyphotic thoracic spine. IMPRESSION: 1. Increasing opacity within the right lower lung, most likely an increasing pleural effusion combined with atelectasis. If febrile, an underlying pneumonia could not be excluded. 2. Stable appearance of the left lung with probable stable atelectasis and/or small effusion. 3. Stable cardiomegaly. Electronically Signed   By: Roxy Horseman.D.  On: 07/01/2015 17:54   US Abdomen Limited Ruq  07/14/2015  CLINICAL DATA:  Abdominal pain, nausea and vomiting for 2 weeks. History of gallstones. EXAM: US ABDOMEN LIMITED - RIGHT UPPER QUADRANT COMPARISON:  None. FINDINGS: Gallbladder: Gallbladder is nearly completely filled with stones and sludge. Gallbladder Sigel is thickened to 7 mm. No pericholecystic fluid. No sonographic Murphy sign elicited during the exam, per the sonographer. Common bile duct: Diameter: Within normal limits at 7 mm. Liver: No focal lesion identified. Within normal limits in parenchymal echogenicity. Incidental note is made of a pleural effusion at the right lung base. Also noted is a small amount of ascites. IMPRESSION: 1. Gallbladder nearly completely filled with stones and/or sludge. Gallbladder Ryker thickened to 7 mm. Gallbladder Fredricksen thickening is a nonspecific finding  with differential considerations including acute or chronic cholecystitis, hypoproteinemia, and reactive thickening due to adjacent liver disease. The absence of a positive sonographic Murphy's sign and pericholecystic fluid makes acute cholecystitis less likely. 2. Right pleural effusion.  Small ascites Electronically Signed   By: Franki Cabot M.D.   On: 07/04/2015 21:17    SIGNIFICANT EVENTS  3/6-3/12  Admit for gallstone pancreatitis   ABX:  Cefepime 3/16 >>  Vanco 3/16 >>    ASSESSMENT / PLAN:  1. Sepsis - in the setting of suspected cholecystitis, gallstone pancreatitis (rising hepatobiliary markers / lipase and previously normal) and possible pulmonary source (RLL atelectasis / effusion).    2. Right Pleural Effusion - with associated atelectasis, possible sympathetic process + CHF   3. Combined systolic / diastolic CHF - EF 62-22%  4. Hypotension  5. Critical Aortic Stenosis   6.  AKI - in setting of sepsis and chronic combined CHF, diuretics  7.  Hyperkalemia - s/p lasix per primary SVC, defer mgmt to primary  Discussion:  82 y/o F with PMH of critical aortic stenosis, combined CHF, HTN and recent admission for gallstone pancreatitis s/p ERCP with sphincterotomy and balloon extraction of stone who is readmitted with sepsis  in the setting of suspected cholecystitis, gallstone pancreatitis (rising hepatobiliary markers / lipase and previously normal) and possible pulmonary source (RLL atelectasis / effusion).  She has been deemed not a surgical candidate for her severe AS and would be very high risk for any surgery.  She has been deemed not a surgical candidate by [facility] per primary.  Discussed medical management extensively with family and the concept of palliative care.  They are agreeable to being evaluated by palliative care.  Also, informed them that even if she is able to improve from this episode, it will likely return in the near future and there will come a time when  aggressive medical interventions (abx, fluids etc) will no longer be appropriate.  They indicate understanding.    Plan: Continue current abx Fluids as ordered  Lasix per primary MD, further labs per primary for hyperkalemia  Agree with Palliative Care involvement  Patient is not a surgical candidate  PRN morphine for pain / discomfort   PCCM will sign off.  Please call back if new needs arise.    Noe Gens, NP-C [facility] Pgr: [phone] or if no answer [phone] 22-Jul-2015, 1:29 PM   Attending Note:  I have examined patient, reviewed labs, studies and notes. I have discussed the case with B Ollis, and I agree with the data and plans as amended above.82 year old woman with history of severe aortic stenosis and gallstone pancreatitis status post recent sphincterotomy and stone removal.  She was admitted with severe sepsis, acute renal failure,  New right pleural effusion, evidence for peripheral hypoperfusion. On my evaluation  Is more comfortable after receiving some morphine aalthough she does still have some abdominal pain on exam. I explained to her and her family the rationale for medical therapy and a possible tyransition to palliation if these non-invasive measures are not effective. They understood the plans and agree. Independent critical care time is 60 minutes.   Baltazar Apo, MD, PhD 2015/07/31, 3:24 PM [facility] [phone] or if no answer [phone]

## 2015-07-31 NOTE — Progress Notes (Signed)
Text page to Dr Izola PriceMyers- Lactic acid now 3.4 K still greater than 7.5 MD said to hold Lasix for now. Made awareS BP is in the low 80's

## 2015-07-31 NOTE — ED Notes (Signed)
Lasix held due to bp trending down. Follow up with ordering MD

## 2015-07-31 NOTE — Consult Note (Addendum)
Referring Physician: Dr. Katrinka BlazingSmith Primary Physician: Primary Cardiologist: Reason for Consultation: severe AS in setting of sepsis   HPI: Ms. Daleen SquibbWall is a 82 year old female with severe AS, combined systolic and diastolic CHF last EF 45-50% in 12/2014, HTN, cholelithiasis; who presents with complaints of emesis and shortness of breath. Patient was just discharged from the hospital on 3/12 with acute gallstone pancreatitis undergoing ERCP on 3/10 but cholecystectomy deferred due to co morbidities including severe AS. Since the patient has been home they note that she's had decreased appetite, abdominal pain, and intermittent emesis. However, today symptoms worsen and she's had 7-8 episodes of vomiting.  Family also reports that she is significantly short of breath and very swollen.     Review of Systems:     Cardiac Review of Systems: {Y] = yes [ ]  = no  Chest Pain [    ]  Resting SOB [   ] Exertional SOB  [  ]  Orthopnea [  ]   Pedal Edema [   ]    Palpitations [  ] Syncope  [  ]   Presyncope [   ]  General Review of Systems: [Y] = yes [  ]=no Constitional: recent weight change [  ]; anorexia [  ]; fatigue [  ]; nausea [  ]; night sweats [  ]; fever [  ]; or chills [  ];                                                                      Eyes : blurred vision [  ]; diplopia [   ]; vision changes [  ];  Amaurosis fugax[  ]; Resp: cough [  ];  wheezing[  ];  hemoptysis[  ];  PND [  ];  GI:  gallstones[  ], vomiting[  ];  dysphagia[  ]; melena[  ];  hematochezia [  ]; heartburn[  ];   GU: kidney stones [  ]; hematuria[  ];   dysuria [  ];  nocturia[  ]; incontinence [  ];             Skin: rash, swelling[  ];, hair loss[  ];  peripheral edema[  ];  or itching[  ]; Musculosketetal: myalgias[  ];  joint swelling[  ];  joint erythema[  ];  joint pain[  ];  back pain[  ];  Heme/Lymph: bruising[  ];  bleeding[  ];  anemia[  ];  Neuro: TIA[  ];  headaches[  ];  stroke[  ];  vertigo[  ];  seizures[   ];   paresthesias[  ];  difficulty walking[  ];  Psych:depression[  ]; anxiety[  ];  Endocrine: diabetes[  ];  thyroid dysfunction[  ];  Other:  Past Medical History  Diagnosis Date  . Acid reflux   . Gallstones   . Heart murmur   . Inner ear disease   . HTN (hypertension) 01/09/2015  . GERD (gastroesophageal reflux disease) 01/09/2015  . Hyperlipidemia 01/09/2015  . History of gallstones 01/09/2015  . Anemia 01/09/2015     (Not in a hospital admission)   . aspirin EC  81 mg Oral Daily  . furosemide  20 mg Intravenous Q6H  .  heparin  5,000 Units Subcutaneous 3 times per day  . ipratropium  0.5 mg Nebulization Q6H  . levalbuterol  0.63 mg Nebulization Q6H  . pantoprazole  40 mg Oral Daily  . sodium chloride flush  3 mL Intravenous Q12H  . sodium chloride flush  3 mL Intravenous Q12H    Infusions: . sodium chloride    . ceFEPime (MAXIPIME) IV      Allergies  Allergen Reactions  . Amoxicillin Other (See Comments)    Intolerance per son  . Salicylates Other (See Comments)    All nsaids - unknown reaction    Social History   Social History  . Marital Status: Married    Spouse Name: [name]  . Number of Children: [name]  . Years of Education: [name]   Occupational History  . Retired    Social History Main Topics  . Smoking status: Never Smoker   . Smokeless tobacco: Not on file  . Alcohol Use: No  . Drug Use: No  . Sexual Activity: No   Other Topics Concern  . Not on file   Social History Narrative   Lives with daughter    Family History  Problem Relation Age of Onset  . Heart failure Mother   . Dementia Father   . Heart attack Father     PHYSICAL EXAM: Filed Vitals:   07/27/2015 0203 07/18/2015 0209  BP: 109/60 106/63  Pulse: 83 84  Temp:    Resp: 22 19     Intake/Output Summary (Last 24 hours) at 07/10/2015 0216 Last data filed at 07/27/2015 0157  Gross per 24 hour  Intake      0 ml  Output     80 ml  Net    -80 ml    General:  Elderly, frail and  chronically ill appearing. No respiratory difficulty HEENT: AT, anicteric, EOMI Neck: supple. Elevated JVP. Carotids 2+ bilat; no bruits. No lymphadenopathy or thryomegaly appreciated. Cor: PMI nondisplaced. Regular rate & rhythm. No rubs, gallops. Normal S1, III/VI SEM with absent S2. Lungs: clear anterior fields Abdomen: soft, nontender, nondistended. No hepatosplenomegaly. No bruits or masses. Good bowel sounds. Extremities: no cyanosis, clubbing, rash. +3 edema bilaterally, cold to touch, cyanotic discoloration bilateral feet. Neuro: alert & oriented x 3, cranial nerves grossly intact. moves all 4 extremities w/o difficulty. Affect pleasant.  ECG: SR, leftward axis, LVH with repol abnormal and QRS widening and possible anterior infarct.  T waves are peaked and QRS is slightly wider when compared to previous concerning for hyperkalemia.   Results for orders placed or performed during the hospital encounter of August 02, 2015 (from the past 24 hour(s))  Lipase, blood     Status: Abnormal   Collection Time: 08-02-15  7:47 PM  Result Value Ref Range   Lipase 151 (H) 11 - 51 U/L  Comprehensive metabolic panel     Status: Abnormal   Collection Time: Aug 02, 2015  7:47 PM  Result Value Ref Range   Sodium 135 135 - 145 mmol/L   Potassium >7.5 (HH) 3.5 - 5.1 mmol/L   Chloride 106 101 - 111 mmol/L   CO2 19 (L) 22 - 32 mmol/L   Glucose, Bld 110 (H) 65 - 99 mg/dL   BUN 52 (H) 6 - 20 mg/dL   Creatinine, Ser 6.96 (H) 0.44 - 1.00 mg/dL   Calcium 8.6 (L) 8.9 - 10.3 mg/dL   Total Protein 6.1 (L) 6.5 - 8.1 g/dL   Albumin 3.1 (L) 3.5 - 5.0 g/dL  AST 83 (H) 15 - 41 U/L   ALT 82 (H) 14 - 54 U/L   Alkaline Phosphatase 122 38 - 126 U/L   Total Bilirubin 1.0 0.3 - 1.2 mg/dL   GFR calc non Af Amer 23 (L) >60 mL/min   GFR calc Af Amer 27 (L) >60 mL/min   Anion gap 10 5 - 15  CBC     Status: Abnormal   Collection Time: 07/17/2015  7:47 PM  Result Value Ref Range   WBC 23.5 (H) 4.0 - 10.5 K/uL   RBC 3.88 3.87 -  5.11 MIL/uL   Hemoglobin 10.6 (L) 12.0 - 15.0 g/dL   HCT 21.3 (L) 08.6 - 57.8 %   MCV 90.7 78.0 - 100.0 fL   MCH 27.3 26.0 - 34.0 pg   MCHC 30.1 30.0 - 36.0 g/dL   RDW 46.9 (H) 62.9 - 52.8 %   Platelets 147 (L) 150 - 400 K/uL  I-Stat Troponin, ED (not at [facility])     Status: Abnormal   Collection Time: 07/10/2015  7:57 PM  Result Value Ref Range   Troponin i, poc 0.30 (HH) 0.00 - 0.08 ng/mL   Comment NOTIFIED PHYSICIAN    Comment 3          Brain natriuretic peptide     Status: Abnormal   Collection Time: 07/04/2015  8:10 PM  Result Value Ref Range   B Natriuretic Peptide >4500.0 (H) 0.0 - 100.0 pg/mL  Urinalysis, Routine w reflex microscopic (not at [facility])     Status: Abnormal   Collection Time: 07/14/2015  8:41 PM  Result Value Ref Range   Color, Urine AMBER (A) YELLOW   APPearance HAZY (A) CLEAR   Specific Gravity, Urine 1.019 1.005 - 1.030   pH 5.0 5.0 - 8.0   Glucose, UA NEGATIVE NEGATIVE mg/dL   Hgb urine dipstick MODERATE (A) NEGATIVE   Bilirubin Urine SMALL (A) NEGATIVE   Ketones, ur NEGATIVE NEGATIVE mg/dL   Protein, ur NEGATIVE NEGATIVE mg/dL   Nitrite NEGATIVE NEGATIVE   Leukocytes, UA NEGATIVE NEGATIVE  Urine microscopic-add on     Status: Abnormal   Collection Time: 06/30/2015  8:41 PM  Result Value Ref Range   Squamous Epithelial / LPF 6-30 (A) NONE SEEN   WBC, UA 0-5 0 - 5 WBC/hpf   RBC / HPF 0-5 0 - 5 RBC/hpf   Bacteria, UA FEW (A) NONE SEEN   Urine-Other YEAST PRESENT   I-Stat CG4 Lactic Acid, ED  (not at  [facility])     Status: Abnormal   Collection Time: 07/22/2015 11:45 PM  Result Value Ref Range   Lactic Acid, Venous 3.17 (HH) 0.5 - 2.0 mmol/L   Comment NOTIFIED PHYSICIAN   CBG monitoring, ED     Status: Abnormal   Collection Time: 07/19/2015 11:55 PM  Result Value Ref Range   Glucose-Capillary 102 (H) 65 - 99 mg/dL  I-Stat Chem 8, ED     Status: Abnormal   Collection Time: 07/23/2015 11:59 PM  Result Value Ref Range   Sodium 135 135 - 145 mmol/L   Potassium 6.8 (HH)  3.5 - 5.1 mmol/L   Chloride 106 101 - 111 mmol/L   BUN 53 (H) 6 - 20 mg/dL   Creatinine, Ser 4.13 (H) 0.44 - 1.00 mg/dL   Glucose, Bld 244 (H) 65 - 99 mg/dL   Calcium, Ion 0.10 2.72 - 1.30 mmol/L   TCO2 19 0 - 100 mmol/L   Hemoglobin 13.3 12.0 - 15.0  g/dL   HCT 24.4 01.0 - 27.2 %   Comment NOTIFIED PHYSICIAN    Dg Chest 2 View  Jul 18, 2015  CLINICAL DATA:  Shortness of breath with fatigue today. History of hypertension. EXAM: CHEST  2 VIEW COMPARISON:  Chest x-rays dated 07/05/2015 and 02/24/2015. FINDINGS: Increasing opacity within the right lower lung. No significant change on the left, with probable stable atelectasis and/or small pleural effusion. Cardiomegaly is again noted. Overall cardiomediastinal silhouette appears stable in size and configuration. Osseous structures about the chest are unremarkable. Mild degenerative change and ankylosis throughout the kyphotic thoracic spine. IMPRESSION: 1. Increasing opacity within the right lower lung, most likely an increasing pleural effusion combined with atelectasis. If febrile, an underlying pneumonia could not be excluded. 2. Stable appearance of the left lung with probable stable atelectasis and/or small effusion. 3. Stable cardiomegaly. Electronically Signed   By: Bary Richard M.D.   On: 07/18/15 17:54   US Abdomen Limited Ruq  07-18-2015  CLINICAL DATA:  Abdominal pain, nausea and vomiting for 2 weeks. History of gallstones. EXAM: US ABDOMEN LIMITED - RIGHT UPPER QUADRANT COMPARISON:  None. FINDINGS: Gallbladder: Gallbladder is nearly completely filled with stones and sludge. Gallbladder Marengo is thickened to 7 mm. No pericholecystic fluid. No sonographic Murphy sign elicited during the exam, per the sonographer. Common bile duct: Diameter: Within normal limits at 7 mm. Liver: No focal lesion identified. Within normal limits in parenchymal echogenicity. Incidental note is made of a pleural effusion at the right lung base. Also noted is a small  amount of ascites. IMPRESSION: 1. Gallbladder nearly completely filled with stones and/or sludge. Gallbladder Quezada thickened to 7 mm. Gallbladder Sposito thickening is a nonspecific finding with differential considerations including acute or chronic cholecystitis, hypoproteinemia, and reactive thickening due to adjacent liver disease. The absence of a positive sonographic Murphy's sign and pericholecystic fluid makes acute cholecystitis less likely. 2. Right pleural effusion.  Small ascites Electronically Signed   By: Bary Richard M.D.   On: Jul 18, 2015 21:17     ASSESSMENT: Ms. Rebecca Woodard is a 82 year old female with severe AS, combined systolic and diastolic CHF last EF 45-50% in 12/2014, HTN, choledocolithiasis; who presents with complaints of emesis and shortness of breath after recent discharge from the hospital on 3/12 with acute gallstone pancreatitis undergoing ERCP on 3/10.  She is a complicated patient with multiple co morbidities.  Her presentation is concerning for sepsis given symptoms, tachypnea, leukocytosis and cholelithiasis with recent instrumentation.  She also has abnormal hepatobiliary markers and lipase, though these were previously abnormal.  She appears to be volume overloaded and marginally to poorly perfused with cold and cyanotic appearing lower extremities in addition to an elevated lactic acid and AKI.  She is also hyperkalemic with ECG changes mentioned above.  Unfortunately, her aortic valve needs replacing but she is currently not a candidate for SAVR nor TAVI.    PLAN/DISCUSSION: Supportive care Hold beta blocker given soft blood pressure Hyperkalemia treatment Prognosis is poor given current clinic picture, consider goals of care discussion

## 2015-07-31 NOTE — ED Notes (Signed)
Called pharmacy, maxipime already sent to receiving unit, [facility]. Zosyn will still be considered coverage with current renal function.

## 2015-07-31 NOTE — ED Notes (Signed)
Patient changed over to a hospital bed.

## 2015-07-31 NOTE — Progress Notes (Signed)
Lab able to draw sufficient quantity  to run ordered lab work

## 2015-07-31 NOTE — ED Notes (Signed)
Report already given to floor RN, Lawson FiscalLori, ED RN updated as well.

## 2015-07-31 NOTE — Progress Notes (Signed)
Dr Izola PriceMyers called - discussed pt's care with DR. Orders placed. Diet ordered

## 2015-07-31 NOTE — Progress Notes (Signed)
Pt admitted from Ed to 2C01 Alert to name and birthday confused as to place and year

## 2015-07-31 NOTE — Plan of Care (Signed)
Problem: Education: Goal: Knowledge of [facility] Education information/materials will improve Outcome: Progressing Educated family on the process of making the patient comfort care.

## 2015-07-31 NOTE — Progress Notes (Addendum)
Patient ID: Rebecca Woodard, female   DOB: 12/15/1922, 82 y.o.   MRN: [id number]  [facility] PROGRESS NOTE  Rebecca Woodard [id number]:[id number] DOB: 12/15/1922 DOA: 07/23/2015 PCP: Rebecca RaiderSHAW,KIMBERLEE, MD   Brief narrative:    82 year old female with a past medical history significant for severe aortic stenosis, combined systolic and diastolic congestive heart failure last EF 45-50% in 12/2014, hypertension, just discharged from the hospital on 3/12 with acute gallstone pancreatitis undergoing ERCP on 3/10. At that time her gallbladder was not removed due to history of severe aortic stenosis. Since being discharged, patient has been home with poor appetite, abdominal pain, and intermittent emesis.   In ED, pt with RR up to 29, blood pressure 91/40, WBC 23.5, hemoglobin 10.6, potassium 7.5, BUN 52 creatinine 1.82, lipase 151, AST 83, ALT 82. Patient was given 10 units of insulin with dextrose, 1g calcium gluconate, and 45 g Kayexalate. TRH asked to admit to SDU.   Assessment/Plan:    Principal Problem:   Severe Sepsis (HCC) - appears to be secondary to PNA, HCAP vs intra abd source, pancreatitis, cholecystitis  - currently on Vancomycin and Maxipime, continue day #2 - narrow down as clinically indicated - CBC in AM - d/w surgery, pt not a candidate for surgical intervention and family made aware   Active Problems:   Acute respiratory failure with hypoxia (HCC) / Acute on chronic diastolic heart failure due to valvular disease (HCC) - secondary to PNA, HCAP with pulmonary vascular congestion, right pleural effusion  - difficult situation, can not give more lasix due to soft BP, currently on Lasix 20 mg IV Q6 hours, per cardio - still with significant LE edema - monitor weights, I/O - ABX as noted above     AKI (acute kidney injury) (HCC) - repeat CMET pending    Nausea & vomiting - from the acute illness, hyperK, cholecystitis  - provide antiemetics as needed - conservative management      Hyperkalemia - given lactulose in ED, repeat CMET pending     Aortic stenosis, severe - per cards     Choledocholithiasis, ? Cholecystitis - with worsening lipase and transaminitis  - US with gallbladder Dipierro thickened to 7 mm, ? acute or chronic cholecystitis - unable to give IVF due to volume overload, continue ABX as noted above  - pain control  - per surgery team, pt not a candidate for surgical intervention due to severe AS    Demand ischemia (HCC) - from acute illness - appreciate cardiology team following    GERD (gastroesophageal reflux disease)  DVT prophylaxis - Heparin SQ  Code Status: Full.  Family Communication:  plan of care discussed with the patient and multiple family members at bedside  Disposition Plan: to be determined, too sick to decide at this point   IV access:  Peripheral IV  Procedures and diagnostic studies:    Dg Chest 2 View 07/14/2015   Increasing opacity within the right lower lung, most likely an increasing pleural effusion combined with atelectasis. If febrile, an underlying pneumonia could not be excluded. 2. Stable appearance of the left lung with probable stable atelectasis and/or small effusion. 3. Stable cardiomegaly.   [place] Abdomen Complete 07/05/2015  Lobular masslike lesion within the lumen of the gallbladder new from prior. Differential includes a tumefactive sludge versus gallbladder carcinoma. No vascularity within the mass suggesting favors tumefactive sludge. 2. Dilatation the common bile duct to 8 to 10 mm. 3. With this combination of gallbladder mass and common bile  duct dilatation, recommend MRI / MRCP  Mr Abdomen Mrcp Wo Cm 07/06/2015 Study is limited by lack of IV gadolinium. With these limitations in mind, the material filling much of the gallbladder is favored to reflect a combination of tumefactive sludge and small gallstones. Given the relatively normal appearance of the gallbladder [place], gallbladder carcinoma is not strongly favored  at this time. 2. There are some small filling defects in the common bile duct, compatible with choledocholithiasis, and the common bile duct is mildly dilated measuring 9 mm in the porta hepatis. This is associated with very mild intrahepatic biliary ductal dilatation. 3. Small amount of T2 hyperintensity in the peripancreatic fat likely reflects underlying pancreatitis. No large peripancreatic fluid collection is noted to suggest pseudocyst at this time. 4. Small right and trace left pleural effusions with areas of increased signal intensity in the visualize lung bases which may reflect atelectasis and/or airspace consolidation. 5. Incompletely visualized mass in the pelvis. While this is favored to represent a fibroid uterus, this does warrant further evaluation with nonemergent pelvic ultrasound in the near future. 6. Additional incidental findings, as above.   US Abdomen Limited Ruq 07/15/2015 Gallbladder nearly completely filled with stones and/or sludge. Gallbladder [place] thickened to 7 mm. Gallbladder [place] thickening is a nonspecific finding with differential considerations including acute or chronic cholecystitis, hypoproteinemia, and reactive thickening due to adjacent liver disease. The absence of a positive sonographic Murphy's sign and pericholecystic fluid makes acute cholecystitis less likely. 2. Right pleural effusion.   Medical Consultants:  PCCM Surgery over the phone PCT  Other Consultants:  None  IAnti-Infectives:   Vancomycin 3/16 --> Maxipime 3/16 -->  [name], MD  [facility] Pager [phone]  If 7PM-7AM, please contact night-coverage [email or web address] Password [facility] 07/06/2015, 12:41 PM   LOS: 1 day   HPI/Subjective: No events overnight. Pt with LE pain.   Objective: Filed Vitals:   07/05/2015 0911 07/29/2015 1000 07/23/2015 1100 07/21/2015 1200  BP:  114/59 112/61 118/61  Pulse:      Temp:    97.5 F (36.4 C)  TempSrc:    Oral  Resp:  Height:      Weight:       SpO2: 99%   77%    Intake/Output Summary (Last 24 hours) at 07/09/2015 1241 Last data filed at 07/23/2015 0342  Gross per 24 hour  Intake      0 ml  Output     95 ml  Net    -95 ml    Exam:   General:  Pt is alert, in mild distress due to pain   Cardiovascular: Regular rate and rhythm, SEM 4/6, no rubs, no gallops  Respiratory: mild tachypnea, crackles at bases   Abdomen: Soft, non tender, non distended, bowel sounds present, no guarding  Extremities: +2 bilateral pitting edema, cyanotic toes, TTP   Data Reviewed: Basic Metabolic Panel:  Recent Labs Lab 07/10/15 0649 07/11/2015 1947 07/13/2015 2359 07/15/2015 0323  NA 140 135 135 135  K 4.8 >7.5* 6.8* >7.5*  CL 108 106 106 103  CO2 22 19*  --  21*  GLUCOSE 103* 110* 127* 106*  BUN 37* 52* 53* 56*  CREATININE 1.30* 1.82* 1.80* 1.98*  CALCIUM 8.8* 8.6*  --  9.5   Liver Function Tests:  Recent Labs Lab 07/10/15 0649 July 16, 2015 1947 07/29/2015 0323  AST 47* 83* 110*  ALT 123* 82* 93*  ALKPHOS 168* 122 124  BILITOT 1.1 1.0 1.6*  PROT 5.8* 6.1*  6.2*  ALBUMIN 3.1* 3.1* 3.2*    Recent Labs Lab 08-03-2015 1947  LIPASE 151*   CBC:  Recent Labs Lab 08/03/2015 1947 August 03, 2015 2359 07/03/2015 0323  WBC 23.5*  --  25.1*  HGB 10.6* 13.3 10.7*  HCT 35.2* 39.0 34.7*  MCV 90.7  --  89.4  PLT 147*  --  168   Cardiac Enzymes:  Recent Labs Lab 07/27/2015 0536  TROPONINI 0.36*   CBG:  Recent Labs Lab 2015/08/03 2355  GLUCAP 102*    Scheduled Meds: . aspirin EC  81 mg Oral Daily  . ceFEPime (MAXIPIME) IV  1 g Intravenous Q24H  . furosemide  20 mg Intravenous Q6H  . heparin  5,000 Units Subcutaneous 3 times per day  . ipratropium  0.5 mg Nebulization Q6H  . levalbuterol  0.63 mg Nebulization Q6H  . pantoprazole  40 mg Oral Daily  . sodium chloride flush  3 mL Intravenous Q12H  . sodium chloride flush  3 mL Intravenous Q12H   Continuous Infusions:

## 2015-07-31 NOTE — ED Notes (Signed)
 10mg  of lasix given, 2nd half to be given after bp recheck.

## 2015-07-31 NOTE — ED Notes (Signed)
Notified Campos, MD of elevated K 6.8

## 2015-07-31 NOTE — ED Notes (Signed)
Remaining 10mg  of lasix given as bp recheck is 106/63, p is 83, foley in place.

## 2015-07-31 NOTE — Progress Notes (Signed)
Pharmacy Antibiotic Note  Arva Chafeannie M Warrick is a 82 y.o. female admitted on 06/08/15 with sepsis.  Pharmacy has been consulted for Cefepime dosing.  Changing Zosyn to Cefepime, already on vancomycin   Plan: -Cefepime 1g IV q24h -Continue vancomycin as ordered  -F/U infectious work-up  Temp (24hrs), Avg:98.2 F (36.8 C), Min:97.5 F (36.4 C), Max:98.9 F (37.2 C)   Recent Labs Lab 07/09/15 0534 07/10/15 0649 02/20/16 1947 02/20/16 2345 02/20/16 2359  WBC  --   --  23.5*  --   --   CREATININE 1.32* 1.30* 1.82*  --  1.80*  LATICACIDVEN  --   --   --  3.17*  --     Estimated Creatinine Clearance: 20.4 mL/min (by C-G formula based on Cr of 1.8).    Allergies  Allergen Reactions  . Amoxicillin Other (See Comments)    Intolerance per son  . Salicylates Other (See Comments)    All nsaids - unknown reaction    Abran DukeLedford, Josephus Harriger 07/07/2015 12:41 AM

## 2015-07-31 NOTE — ED Notes (Signed)
Discussed labs with dr. Katrinka BlazingSmith, K+ back up to over 7.5, lactic acid down from 3.17 to 2.0, very low urine output, 20mL since insertion at 0200.

## 2015-07-31 NOTE — Discharge Summary (Signed)
Patient ID: Rebecca Woodard, female   DOB: 1922/12/18, 82 y.o.   MRN: [id number]  Death Summary  BRENLY TRAWICK [id number]:[id number] DOB: 23-May-1922 DOA: 08/06/2015  PCP: Lupita Raider, MD PCP/Office notified:   Admit date: 2015/08/06 Date of Death: August 07, 2015  Final Diagnoses:   Brief narrative:    82 year old female with a past medical history significant for severe aortic stenosis, combined systolic and diastolic congestive heart failure last EF 45-50% in 12/2014, hypertension, just discharged from the hospital on 3/12 with acute gallstone pancreatitis undergoing ERCP on 3/10. At that time her gallbladder was not removed due to history of severe aortic stenosis. Since being discharged, patient has been home with poor appetite, abdominal pain, and intermittent emesis.   In ED, pt with RR up to 29, blood pressure 91/40, WBC 23.5, hemoglobin 10.6, potassium 7.5, BUN 52 creatinine 1.82, lipase 151, AST 83, ALT 82. Patient was given 10 units of insulin with dextrose, 1g calcium gluconate, and 45 g Kayexalate. TRH asked to admit to SDU.   Assessment/Plan:    Principal Problem:  Severe Sepsis (HCC) - appears to be secondary to PNA, HCAP vs intra abd source, pancreatitis, cholecystitis  - was on Vancomycin and Maxipime but failed to respond to medical therapy  - transition to comfort desired, wishes respected   Active Problems:  Acute respiratory failure with hypoxia (HCC) / Acute on chronic diastolic heart failure due to valvular disease (HCC) - secondary to PNA, HCAP with pulmonary vascular congestion, right pleural effusion  - difficult situation, could not give more lasix due to soft BP - failure to respond to medical therapy    AKI (acute kidney injury) (HCC) - secondary to the above    Nausea & vomiting - from the acute illness, hyperK, cholecystitis  - provided antiemetics as needed - transitioned to comfort after pt failed to respond to medical therapy    Hyperkalemia -  given lactulose in ED, repeat CMET with refractory hyperkalemia  - pt was very clear she did not wants any HD   Aortic stenosis, severe - determined not to be a candidate for any cardiac intervention    Choledocholithiasis, ? Cholecystitis - with worsening lipase and transaminitis  - [place] with gallbladder [place] thickened to 7 mm, ? acute or chronic cholecystitis - unable to give IVF due to volume overload, continued ABX as noted above    Demand ischemia (HCC) - from acute illness - cardiology was consulted    GERD (gastroesophageal reflux disease)  Code Status: DNR  IV access:  Peripheral IV  Procedures and diagnostic studies:   Dg Chest 2 View 08/06/2015 Increasing opacity within the right lower lung, most likely an increasing pleural effusion combined with atelectasis. If febrile, an underlying pneumonia could not be excluded. 2. Stable appearance of the left lung with probable stable atelectasis and/or small effusion. 3. Stable cardiomegaly.   US Abdomen Complete 07/05/2015 Lobular masslike lesion within the lumen of the gallbladder new from prior. Differential includes a tumefactive sludge versus gallbladder carcinoma. No vascularity within the mass suggesting favors tumefactive sludge. 2. Dilatation the common bile duct to 8 to 10 mm. 3. With this combination of gallbladder mass and common bile duct dilatation, recommend MRI / MRCP  Mr Abdomen Mrcp Wo Cm 07/06/2015 Study is limited by lack of IV gadolinium. With these limitations in mind, the material filling much of the gallbladder is favored to reflect a combination of tumefactive sludge and small gallstones. Given the relatively normal appearance of the gallbladder [place],  gallbladder carcinoma is not strongly favored at this time. 2. There are some small filling defects in the common bile duct, compatible with choledocholithiasis, and the common bile duct is mildly dilated measuring 9 mm in the porta hepatis. This is  associated with very mild intrahepatic biliary ductal dilatation. 3. Small amount of T2 hyperintensity in the peripancreatic fat likely reflects underlying pancreatitis. No large peripancreatic fluid collection is noted to suggest pseudocyst at this time. 4. Small right and trace left pleural effusions with areas of increased signal intensity in the visualize lung bases which may reflect atelectasis and/or airspace consolidation. 5. Incompletely visualized mass in the pelvis. While this is favored to represent a fibroid uterus, this does warrant further evaluation with nonemergent pelvic ultrasound in the near future. 6. Additional incidental findings, as above.   [place] Abdomen Limited Ruq July 09, 2015 Gallbladder nearly completely filled with stones and/or sludge. Gallbladder Meck[place] thickened to 7 mm. Gallbladder [place] thickening is a nonspecific finding with differential considerations including acute or chronic cholecystitis, hypoproteinemia, and reactive thickening due to adjacent liver disease. The absence of a positive sonographic Murphy's sign and pericholecystic fluid makes acute cholecystitis less likely. 2. Right pleural effusion.   Medical Consultants:  PCCM Surgery over the phone PCT  Other Consultants:  None  IAnti-Infectives:   Vancomycin 3/16 --> Maxipime 3/16 -->      Time: was not present at the time of the death   Signed:  Debbora PrestoMAGICK-Jilberto Vanderwall  [facility] 07/30/2015, 8:58 PM

## 2015-07-31 NOTE — Progress Notes (Signed)
Another attempt to draw labs unable to get sufficient quantity to run ordered labs

## 2015-07-31 NOTE — Plan of Care (Signed)
Problem: Pain Managment: Goal: General experience of comfort will improve Outcome: Progressing Discussed plan of care with relation to pain control with family

## 2015-07-31 NOTE — ED Notes (Signed)
Warm packets given to warm up hands.

## 2015-07-31 NOTE — ED Notes (Signed)
Regular hospital bed ordered, repositioned patient as requested.

## 2015-07-31 NOTE — ED Notes (Signed)
Unsuccessful repeat lab draw per phlebotomy.

## 2015-07-31 NOTE — Progress Notes (Signed)
Utilization Review Completed.  

## 2015-07-31 NOTE — ED Notes (Signed)
2nd half of lasix given with bp 115/60

## 2015-07-31 DEATH — deceased

## 2015-10-08 ENCOUNTER — Ambulatory Visit: Payer: Medicare Other | Admitting: Cardiovascular Disease

## 2015-11-13 IMAGING — CR DG CHEST 2V
2 series · 2 of 2 positions shown · non-contrast
Comparison: None.

CLINICAL DATA: Shortness of breath for the last few weeks,
worsening

EXAM:
CHEST  2 VIEW

[chest lat]
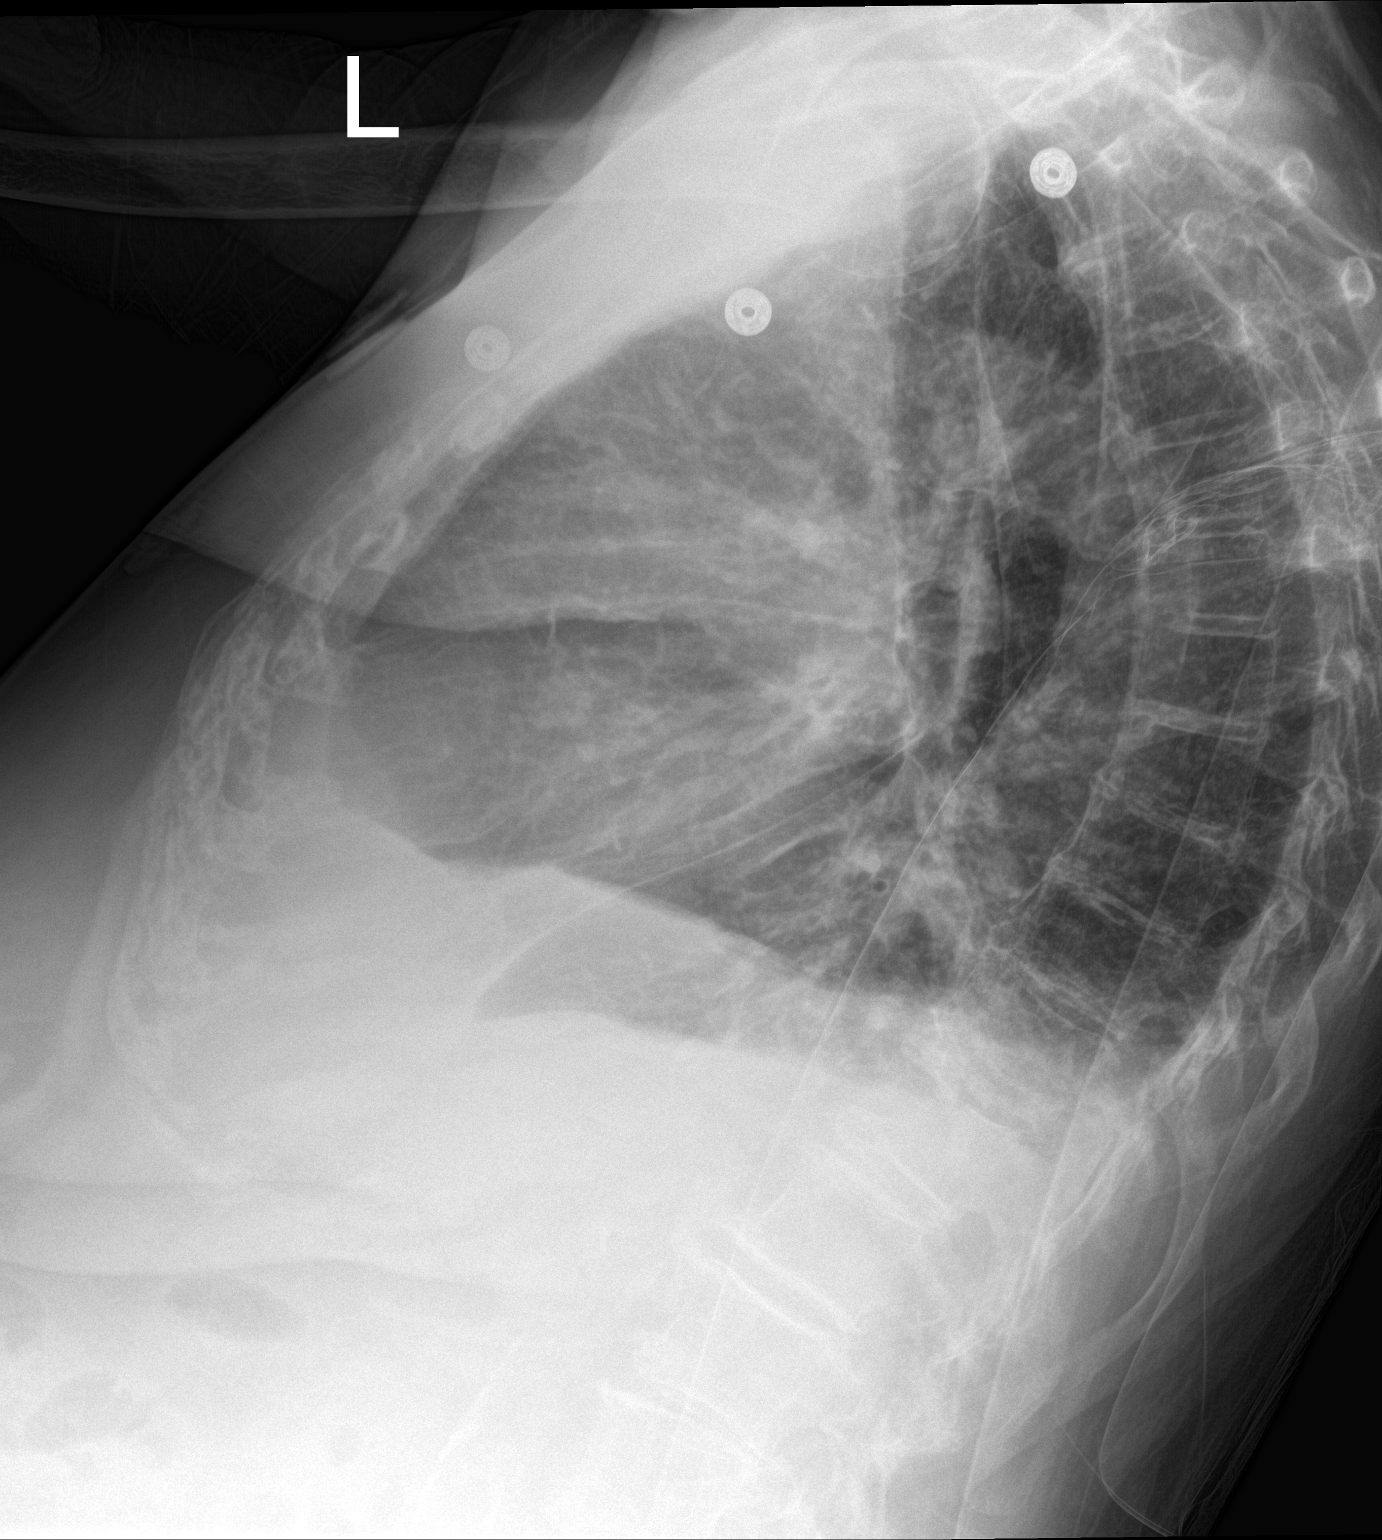

[chest ap]
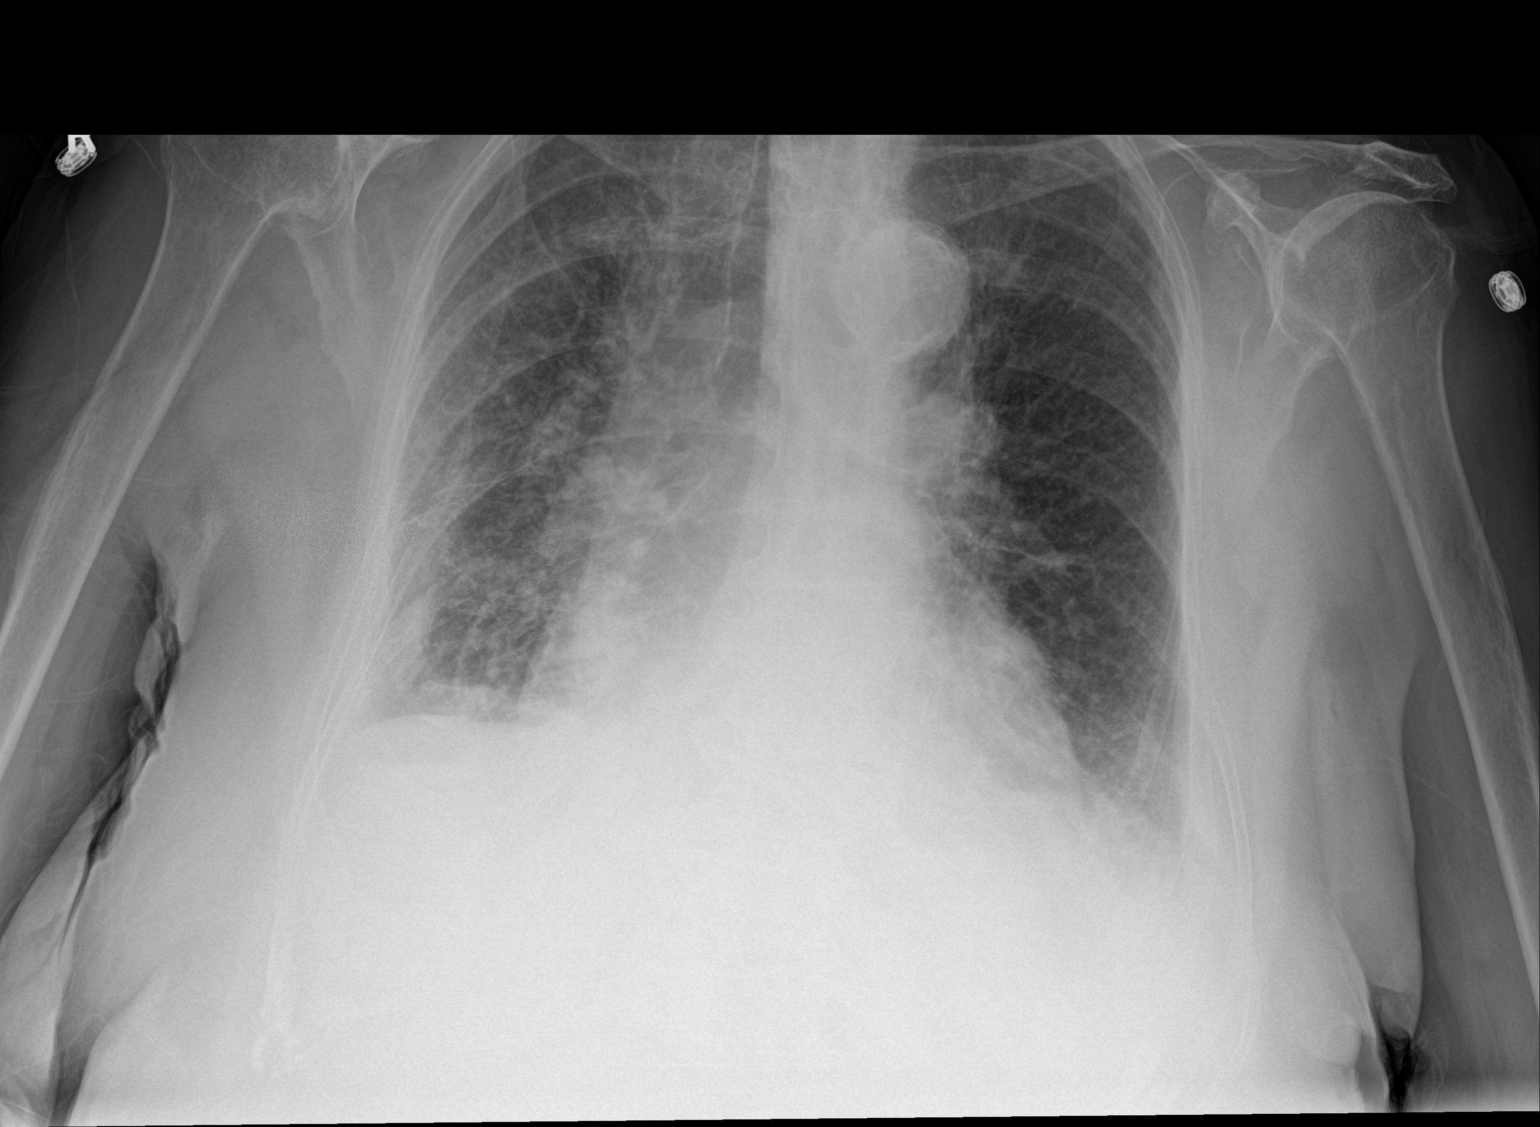

[2 of 2 positions shown; findings below may reference images not displayed]

FINDINGS: CHF pattern with diffuse interstitial opacity and small bilateral
pleural effusions. Atypical infection could have this appearance but
there is no indication of infectious symptoms per report and the
heart and vascular pedicle are enlarged.
IMPRESSION: CHF pattern.

## 2016-10-27 IMAGING — US US ABDOMEN LIMITED
1 series · 14 of 25 positions shown · non-contrast
Comparison: None.

CLINICAL DATA: Abdominal pain, nausea and vomiting for 2 weeks.
History of gallstones.

EXAM:
US ABDOMEN LIMITED - RIGHT UPPER QUADRANT

[Series 1: us abdomen limited · 0.20mm/px · 14 of 44 slices shown]
[im 1/44]
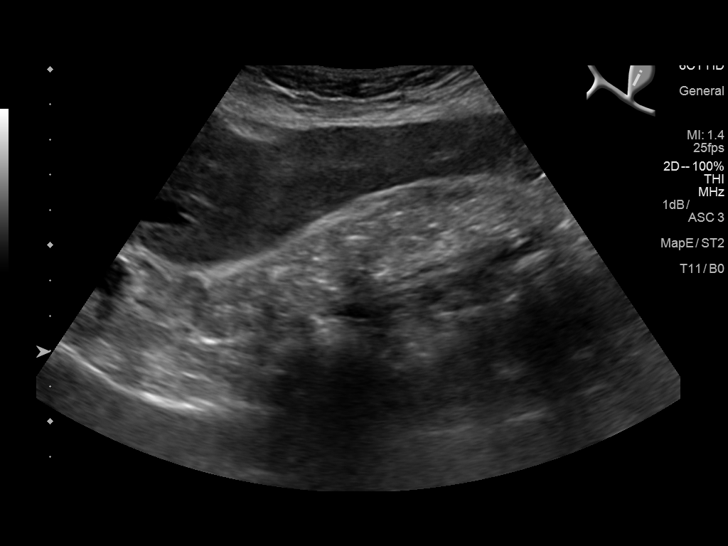
[im 4/44]
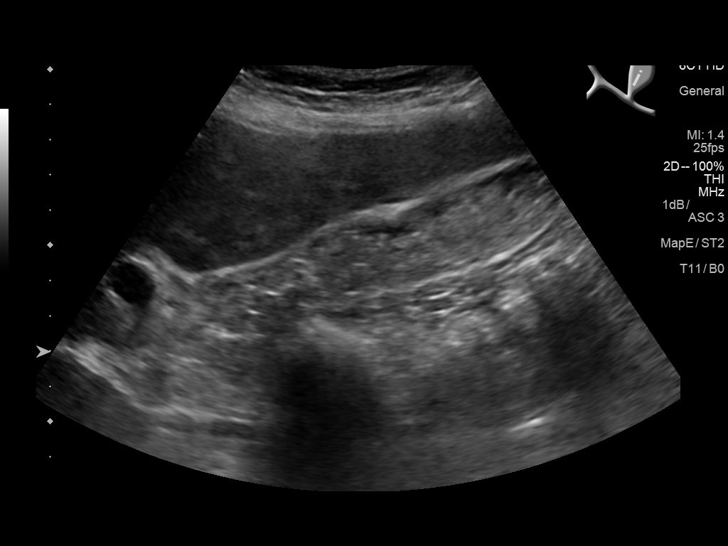
[im 8/44]
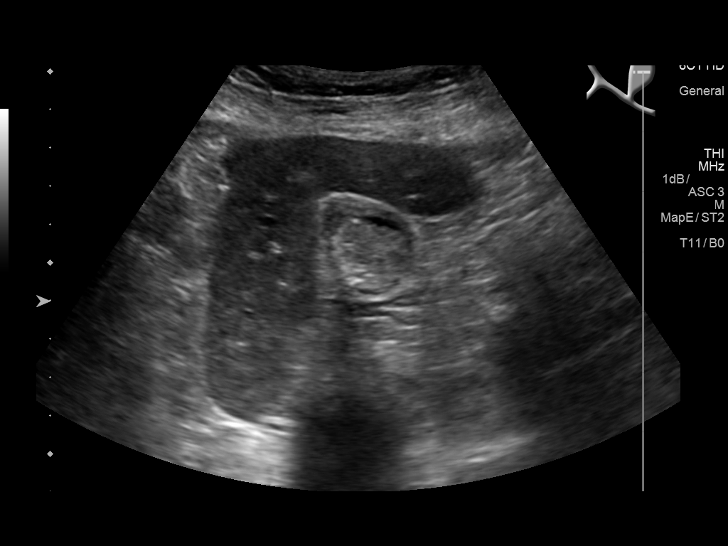
[im 11/44]
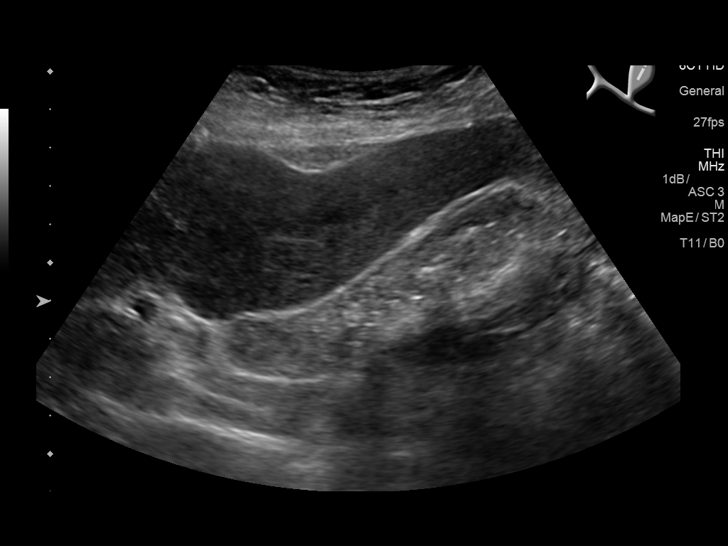
[im 15/44]
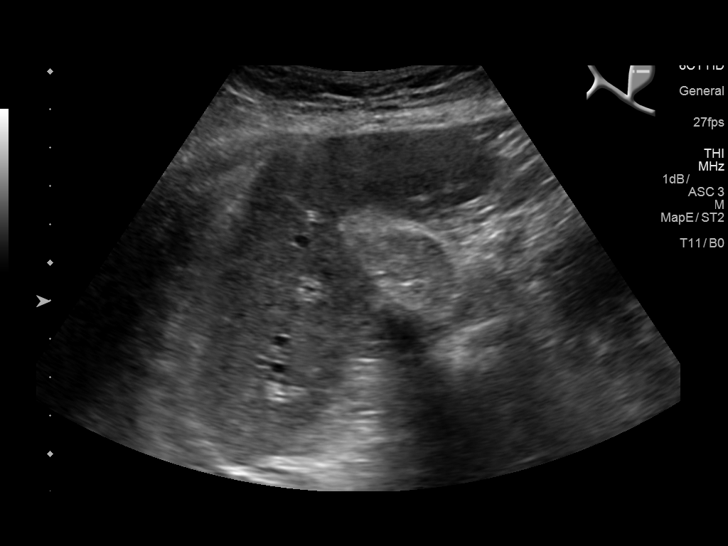
[im 17/44]
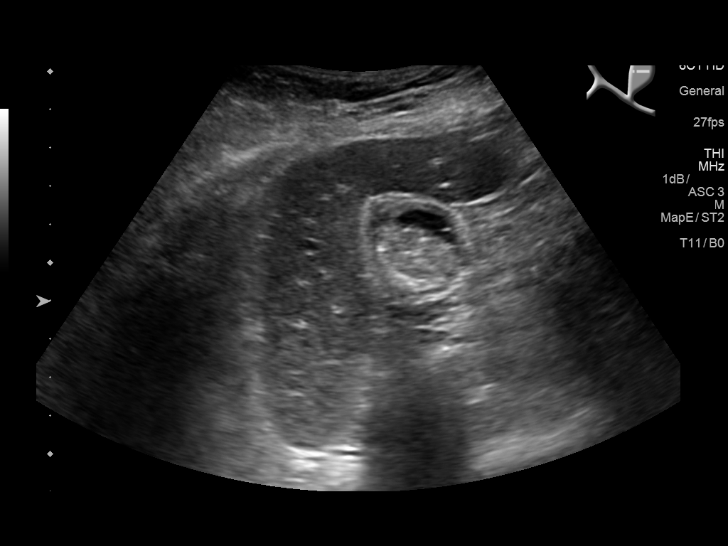
[im 20/44]
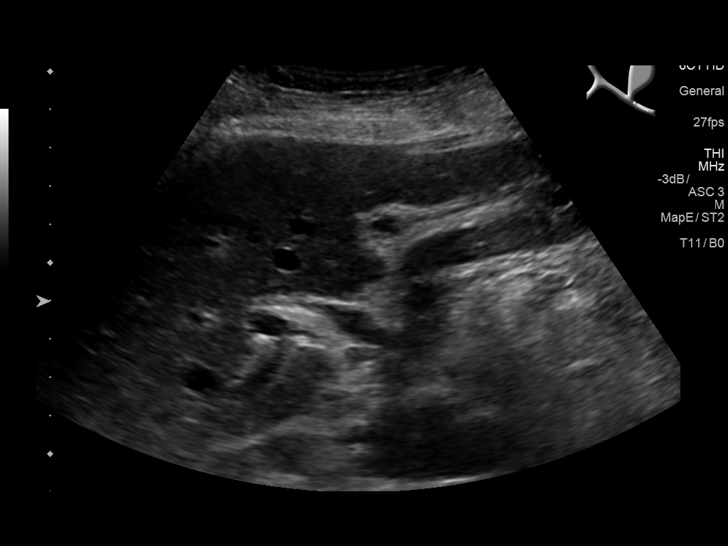
[im 24/44]
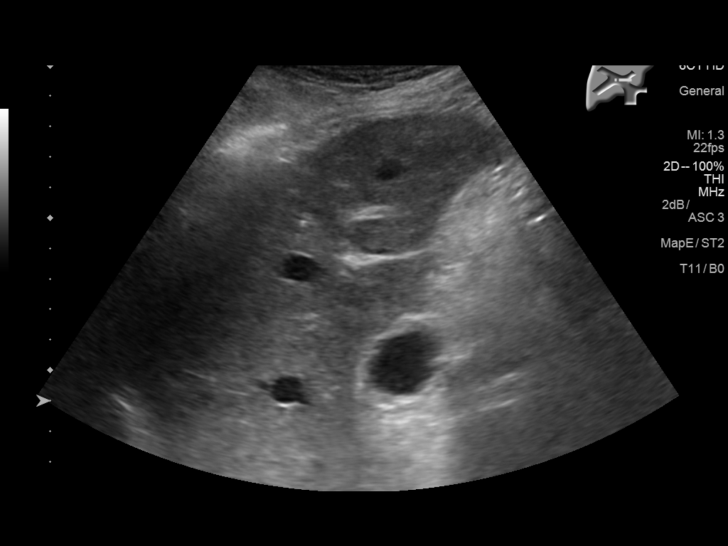
[im 27/44]
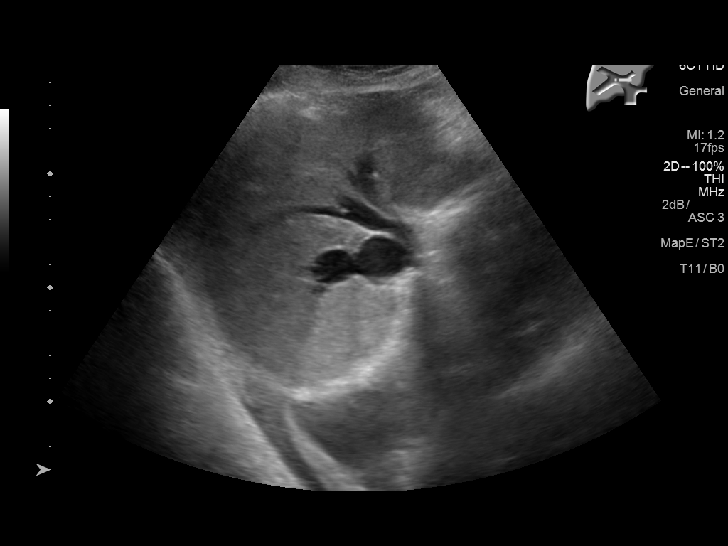
[im 29/44]
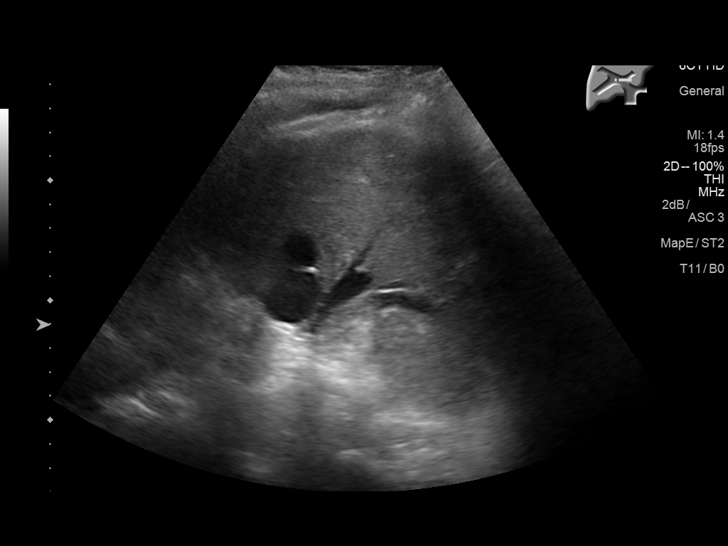
[im 33/44]
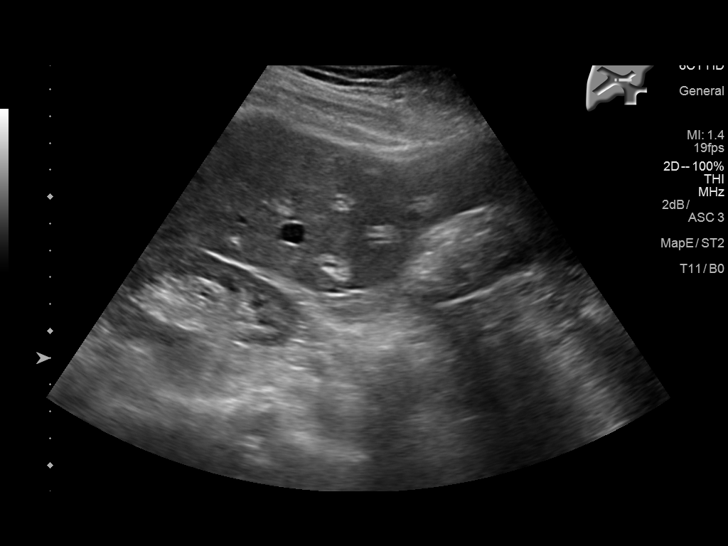
[im 36/44]
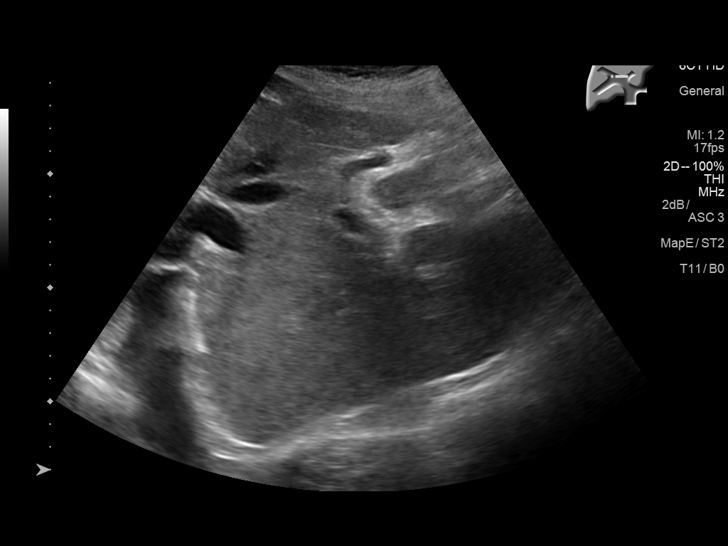
[im 40/44]
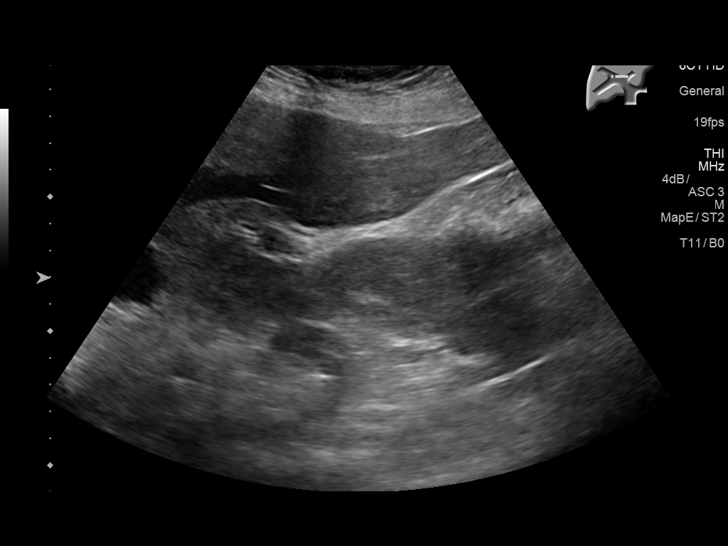
[im 44/44]
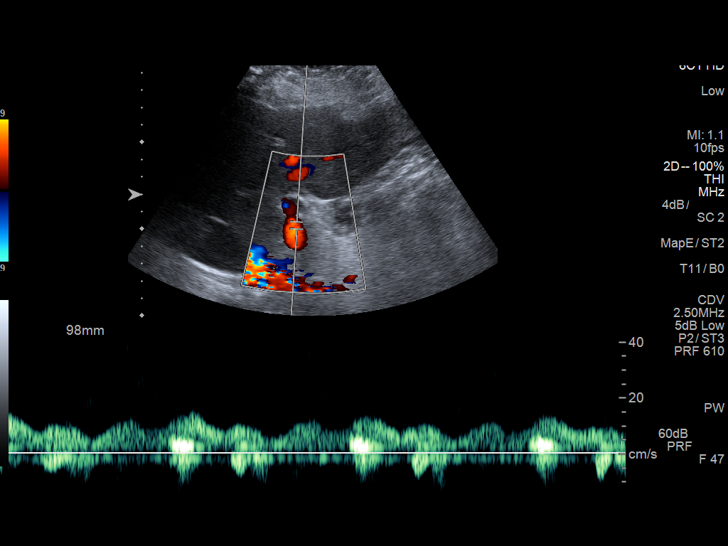

[14 of 25 positions shown; findings below may reference images not displayed]

FINDINGS: Gallbladder:

Gallbladder is nearly completely filled with stones and sludge.
Gallbladder wall is thickened to 7 mm. No pericholecystic fluid. No
sonographic Murphy sign elicited during the exam, per the
sonographer.

Common bile duct:

Diameter: Within normal limits at 7 mm.

Liver:

No focal lesion identified. Within normal limits in parenchymal
echogenicity.

Incidental note is made of a pleural effusion at the right lung
base. Also noted is a small amount of ascites.
IMPRESSION: 1. Gallbladder nearly completely filled with stones and/or sludge.
Gallbladder wall thickened to 7 mm. Gallbladder wall thickening is a
nonspecific finding with differential considerations including acute
or chronic cholecystitis, hypoproteinemia, and reactive thickening
due to adjacent liver disease. The absence of a positive sonographic
Murphy's sign and pericholecystic fluid makes acute cholecystitis
less likely.
2. Right pleural effusion.  Small ascites
